# Patient Record
Sex: Female | Born: 1935 | Race: White | Hispanic: No | Marital: Married | Smoking: Former smoker
Health system: Southern US, Community
[De-identification: ages and names within clinical notes are randomized; demographics above are authoritative.]

## PROBLEM LIST (undated history)

## (undated) DIAGNOSIS — M1990 Unspecified osteoarthritis, unspecified site: Secondary | ICD-10-CM

## (undated) DIAGNOSIS — I10 Essential (primary) hypertension: Secondary | ICD-10-CM

## (undated) HISTORY — PX: ABDOMINAL HYSTERECTOMY: SHX81

---

## 1993-01-03 HISTORY — PX: JOINT REPLACEMENT: SHX530

## 1995-01-04 HISTORY — PX: JOINT REPLACEMENT: SHX530

## 1997-05-13 ENCOUNTER — Other Ambulatory Visit: Admission: RE | Admit: 1997-05-13 | Discharge: 1997-05-13 | Payer: Self-pay | Admitting: Obstetrics and Gynecology

## 1998-05-18 ENCOUNTER — Other Ambulatory Visit: Admission: RE | Admit: 1998-05-18 | Discharge: 1998-05-18 | Payer: Self-pay | Admitting: Obstetrics and Gynecology

## 1998-09-24 ENCOUNTER — Encounter (INDEPENDENT_AMBULATORY_CARE_PROVIDER_SITE_OTHER): Payer: Self-pay

## 1998-09-24 ENCOUNTER — Inpatient Hospital Stay (HOSPITAL_COMMUNITY): Admission: RE | Admit: 1998-09-24 | Discharge: 1998-09-26 | Payer: Self-pay | Admitting: Obstetrics and Gynecology

## 1998-11-24 ENCOUNTER — Encounter: Payer: Self-pay | Admitting: Obstetrics and Gynecology

## 1998-11-24 ENCOUNTER — Ambulatory Visit (HOSPITAL_COMMUNITY): Admission: RE | Admit: 1998-11-24 | Discharge: 1998-11-24 | Payer: Self-pay | Admitting: Obstetrics and Gynecology

## 1998-12-02 ENCOUNTER — Ambulatory Visit (HOSPITAL_COMMUNITY): Admission: RE | Admit: 1998-12-02 | Discharge: 1998-12-02 | Payer: Self-pay | Admitting: Obstetrics and Gynecology

## 1998-12-02 ENCOUNTER — Encounter: Payer: Self-pay | Admitting: Obstetrics and Gynecology

## 1999-02-19 ENCOUNTER — Encounter: Payer: Self-pay | Admitting: Obstetrics and Gynecology

## 1999-02-23 ENCOUNTER — Inpatient Hospital Stay (HOSPITAL_COMMUNITY): Admission: RE | Admit: 1999-02-23 | Discharge: 1999-02-25 | Payer: Self-pay | Admitting: Obstetrics and Gynecology

## 1999-05-24 ENCOUNTER — Other Ambulatory Visit: Admission: RE | Admit: 1999-05-24 | Discharge: 1999-05-24 | Payer: Self-pay | Admitting: Obstetrics and Gynecology

## 1999-06-22 ENCOUNTER — Encounter: Payer: Self-pay | Admitting: Internal Medicine

## 1999-06-22 ENCOUNTER — Encounter: Admission: RE | Admit: 1999-06-22 | Discharge: 1999-06-22 | Payer: Self-pay | Admitting: Internal Medicine

## 2000-04-05 ENCOUNTER — Ambulatory Visit (HOSPITAL_COMMUNITY): Admission: RE | Admit: 2000-04-05 | Discharge: 2000-04-05 | Payer: Self-pay | Admitting: Internal Medicine

## 2000-04-05 ENCOUNTER — Encounter: Payer: Self-pay | Admitting: Internal Medicine

## 2000-06-19 ENCOUNTER — Other Ambulatory Visit: Admission: RE | Admit: 2000-06-19 | Discharge: 2000-06-19 | Payer: Self-pay | Admitting: Obstetrics and Gynecology

## 2000-06-22 ENCOUNTER — Encounter: Admission: RE | Admit: 2000-06-22 | Discharge: 2000-06-22 | Payer: Self-pay | Admitting: Internal Medicine

## 2000-06-22 ENCOUNTER — Encounter: Payer: Self-pay | Admitting: Internal Medicine

## 2001-05-16 ENCOUNTER — Ambulatory Visit (HOSPITAL_COMMUNITY): Admission: RE | Admit: 2001-05-16 | Discharge: 2001-05-16 | Payer: Self-pay | Admitting: Gastroenterology

## 2001-05-16 ENCOUNTER — Encounter (INDEPENDENT_AMBULATORY_CARE_PROVIDER_SITE_OTHER): Payer: Self-pay | Admitting: Specialist

## 2001-06-28 ENCOUNTER — Encounter: Payer: Self-pay | Admitting: Internal Medicine

## 2001-06-28 ENCOUNTER — Encounter: Admission: RE | Admit: 2001-06-28 | Discharge: 2001-06-28 | Payer: Self-pay | Admitting: Internal Medicine

## 2001-07-19 ENCOUNTER — Other Ambulatory Visit: Admission: RE | Admit: 2001-07-19 | Discharge: 2001-07-19 | Payer: Self-pay | Admitting: Obstetrics and Gynecology

## 2001-08-27 ENCOUNTER — Ambulatory Visit (HOSPITAL_COMMUNITY): Admission: RE | Admit: 2001-08-27 | Discharge: 2001-08-27 | Payer: Self-pay | Admitting: Internal Medicine

## 2001-12-19 ENCOUNTER — Encounter: Payer: Self-pay | Admitting: Emergency Medicine

## 2001-12-19 ENCOUNTER — Emergency Department (HOSPITAL_COMMUNITY): Admission: EM | Admit: 2001-12-19 | Discharge: 2001-12-19 | Payer: Self-pay | Admitting: Emergency Medicine

## 2002-07-03 ENCOUNTER — Encounter: Payer: Self-pay | Admitting: Obstetrics and Gynecology

## 2002-07-03 ENCOUNTER — Encounter: Admission: RE | Admit: 2002-07-03 | Discharge: 2002-07-03 | Payer: Self-pay | Admitting: Obstetrics and Gynecology

## 2003-08-21 ENCOUNTER — Encounter: Admission: RE | Admit: 2003-08-21 | Discharge: 2003-08-21 | Payer: Self-pay | Admitting: Internal Medicine

## 2004-08-27 ENCOUNTER — Encounter: Admission: RE | Admit: 2004-08-27 | Discharge: 2004-08-27 | Payer: Self-pay | Admitting: Internal Medicine

## 2005-09-08 ENCOUNTER — Encounter: Admission: RE | Admit: 2005-09-08 | Discharge: 2005-09-08 | Payer: Self-pay | Admitting: Internal Medicine

## 2006-09-13 ENCOUNTER — Encounter: Admission: RE | Admit: 2006-09-13 | Discharge: 2006-09-13 | Payer: Self-pay | Admitting: Internal Medicine

## 2007-09-19 ENCOUNTER — Encounter: Admission: RE | Admit: 2007-09-19 | Discharge: 2007-09-19 | Payer: Self-pay | Admitting: Internal Medicine

## 2008-09-23 ENCOUNTER — Encounter: Admission: RE | Admit: 2008-09-23 | Discharge: 2008-09-23 | Payer: Self-pay | Admitting: Internal Medicine

## 2008-11-05 ENCOUNTER — Encounter: Admission: RE | Admit: 2008-11-05 | Discharge: 2008-11-05 | Payer: Self-pay | Admitting: Family Medicine

## 2008-11-05 IMAGING — US US ABDOMEN COMPLETE
1 series · 14 of 25 positions shown · non-contrast
Comparison: None.

CLINICAL DATA: Abdominal pain and dysphagia.

COMPLETE ABDOMINAL ULTRASOUND

[Series 1: us abdomen complete · 0.22mm/px · 14 of 86 slices shown]
[im 1/86]
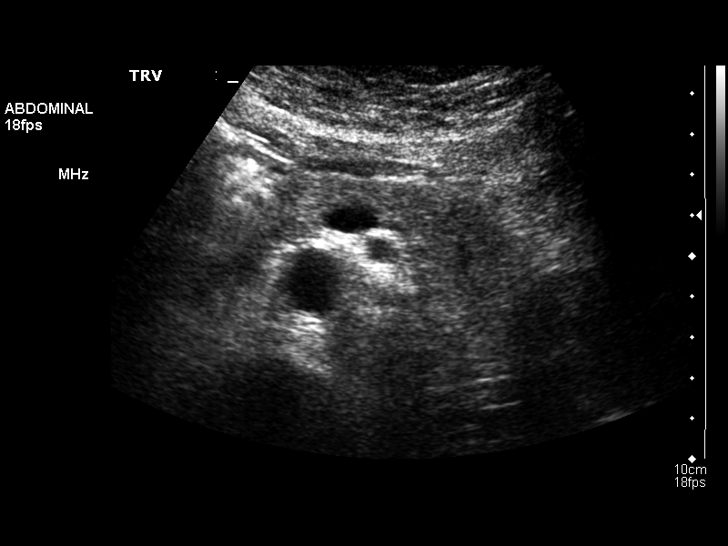
[im 8/86]
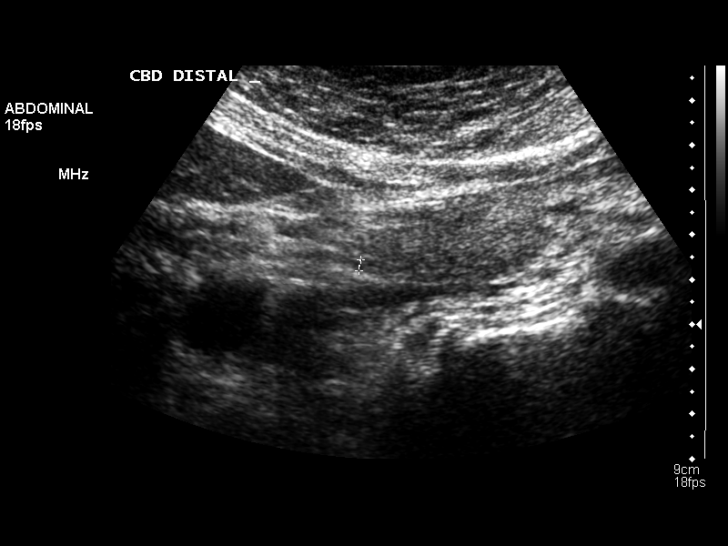
[im 15/86]
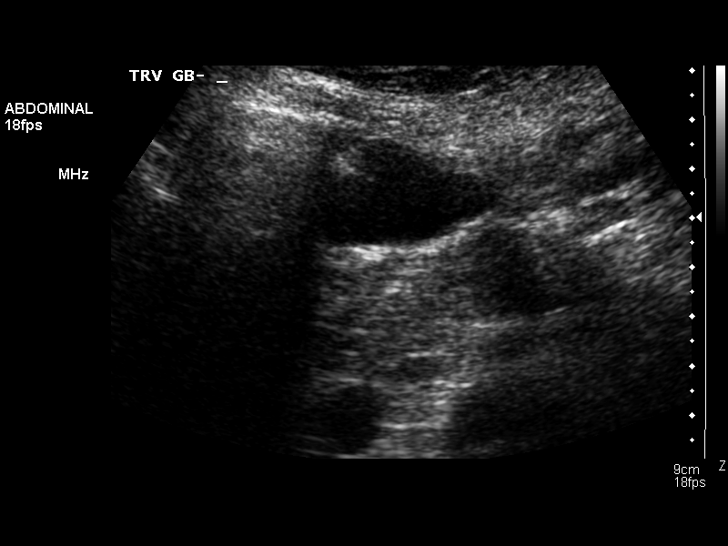
[im 22/86]
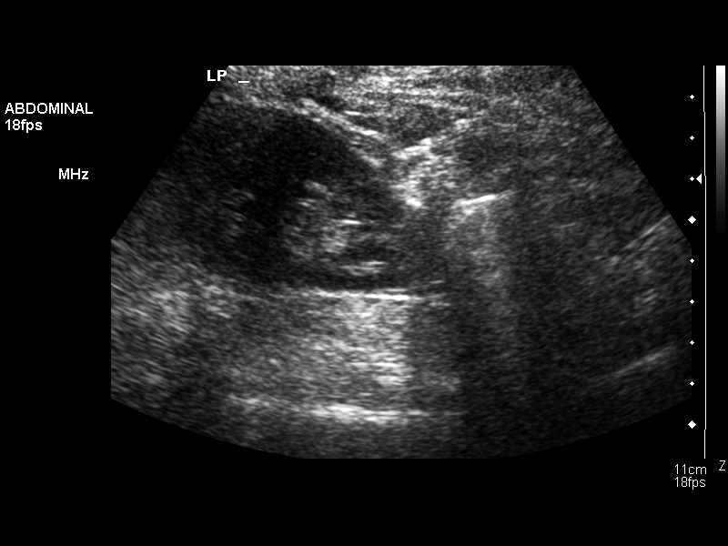
[im 29/86]
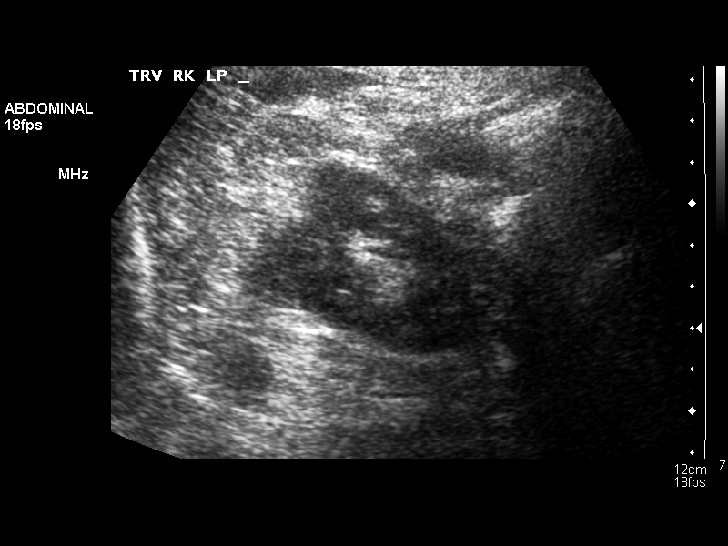
[im 32/86]
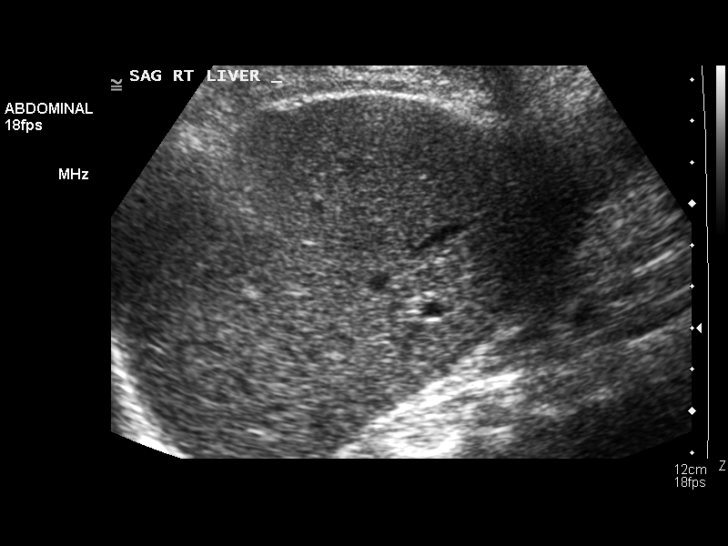
[im 39/86]
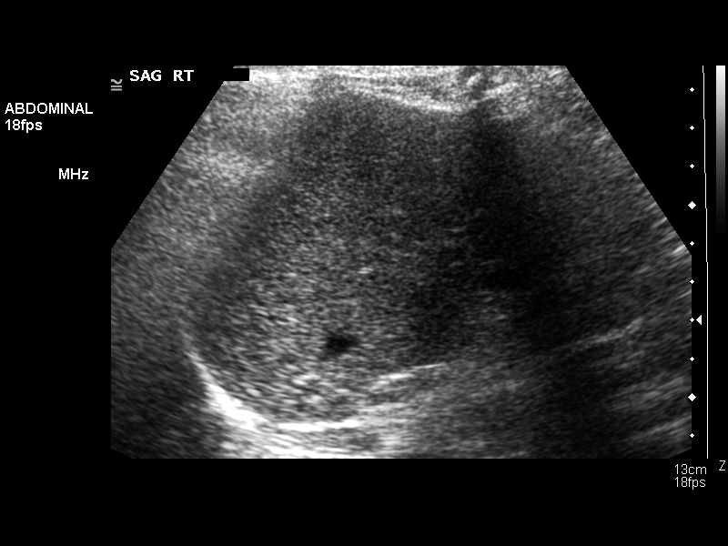
[im 47/86]
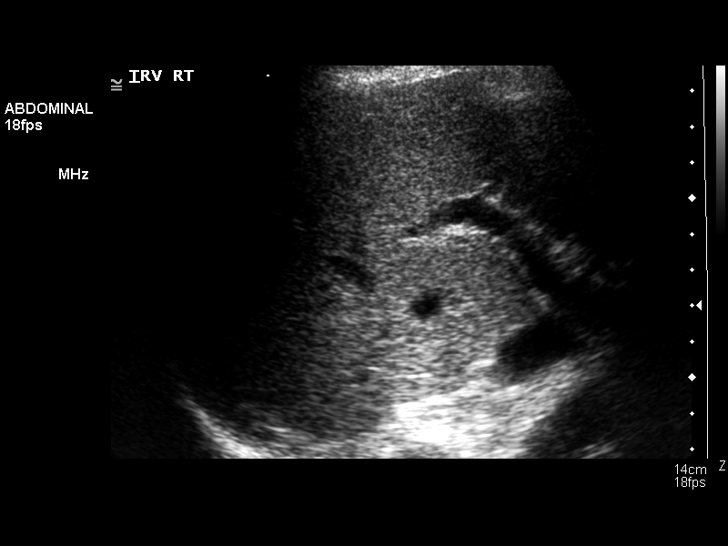
[im 54/86]
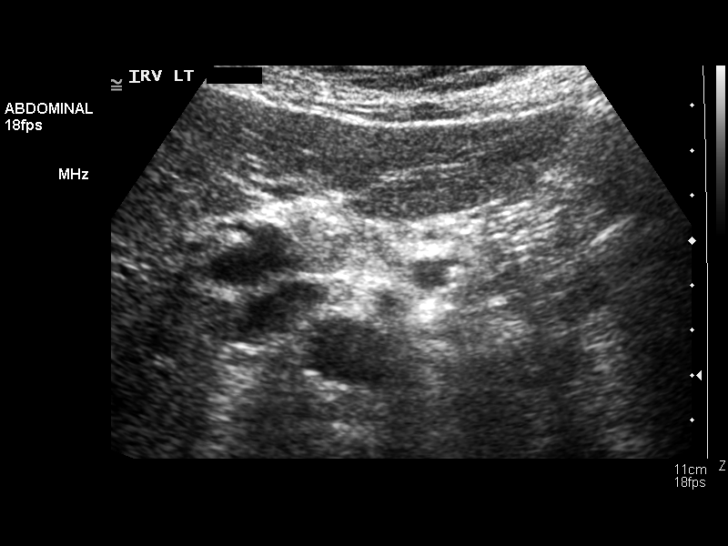
[im 57/86]
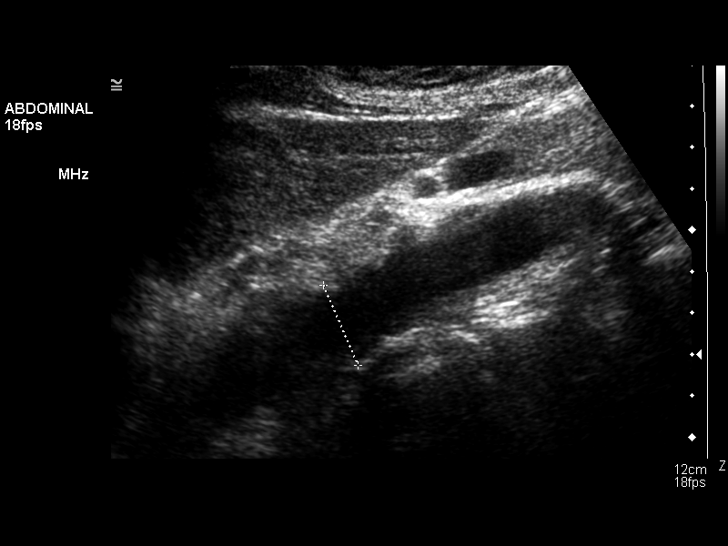
[im 64/86]
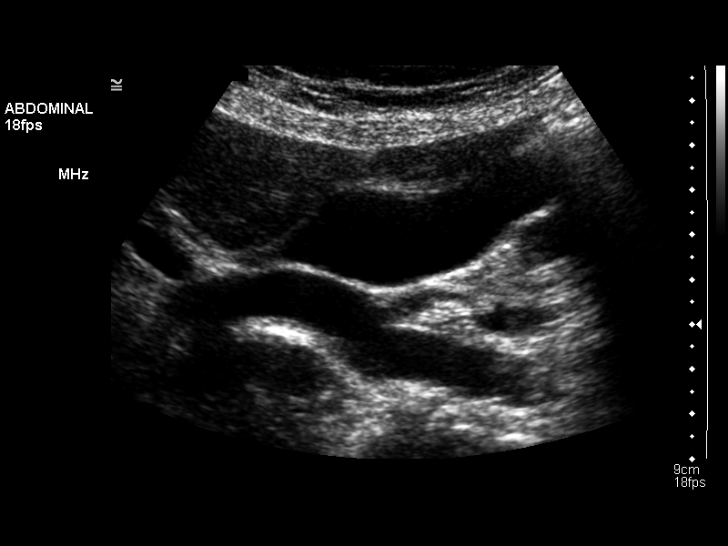
[im 71/86]
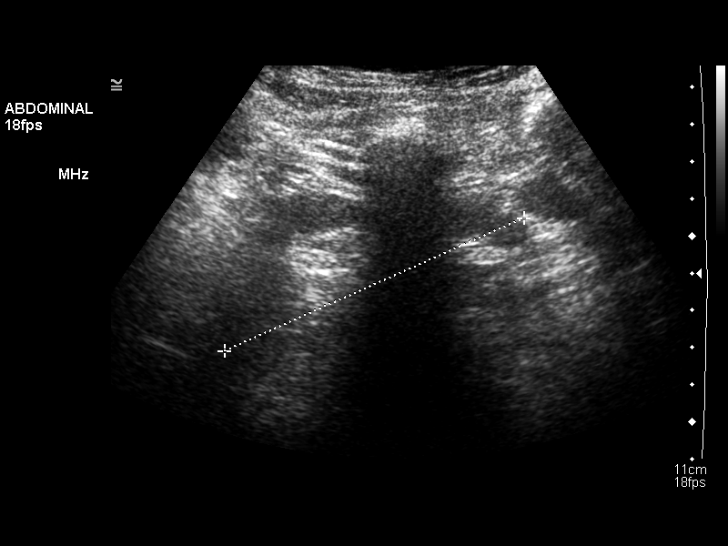
[im 78/86]
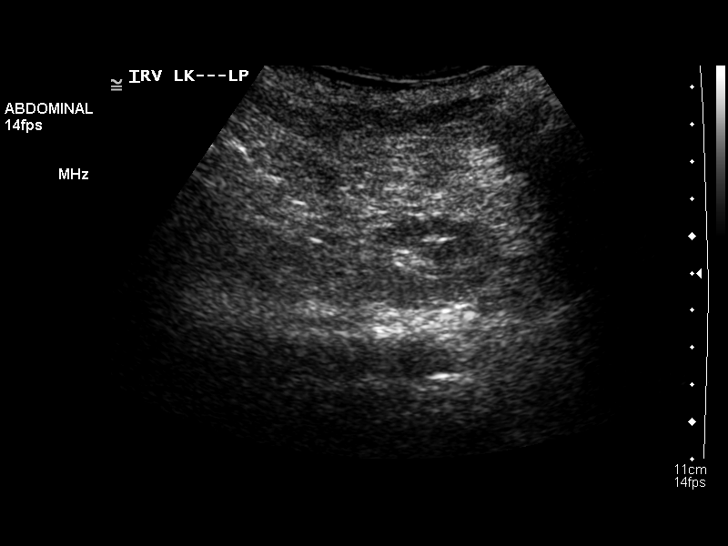
[im 86/86]
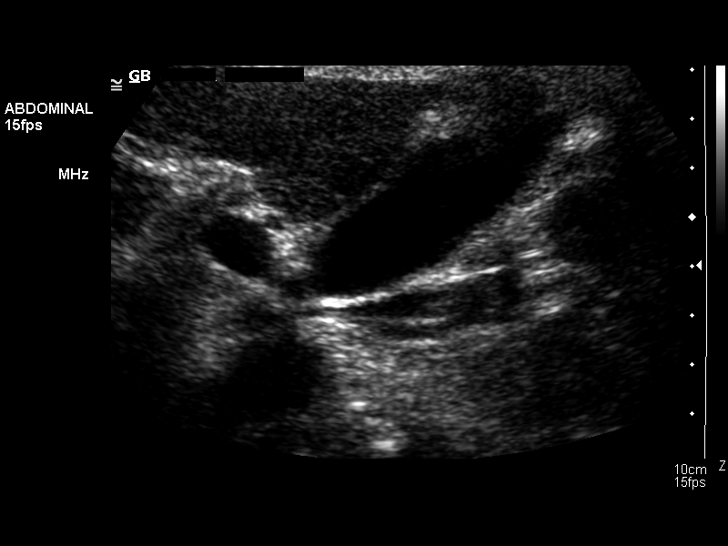

[14 of 25 positions shown; findings below may reference images not displayed]

FINDINGS: Gallbladder:  Negative.

Common bile duct:  3 mm, within normal limits.

Liver:  Negative.

IVC:  Visualized.

Pancreas:  Negative.

Spleen:  Measures 3.3 cm, negative.

Right Kidney:  Measures 11.4 cm, negative.

Left Kidney:  Visualization is somewhat limited by bowel gas.
Measures approximately 8.8 cm.

Abdominal aorta:  Atherosclerotic.  No aneurysm.
IMPRESSION: No acute findings.

## 2009-09-24 ENCOUNTER — Encounter: Admission: RE | Admit: 2009-09-24 | Discharge: 2009-09-24 | Payer: Self-pay | Admitting: Internal Medicine

## 2010-05-21 NOTE — Procedures (Signed)
[facility]  Patient:    Shelley Sloan, VOLK Visit Number: [id number] MRN: [id number]          Service Type: END Location: ENDO Attending Physician:  Dennison Bulla Ii Dictated by:   Verlin Grills, M.D. Proc. Date: 05/16/01 Admit Date:  05/16/2001   CC:         Erskine Speed, M.D.   Procedure Report  PROCEDURE:  Colonoscopy with rectal polypectomy.  REFERRING PHYSICIAN:  Erskine Speed, M.D.  INDICATION FOR PROCEDURE:  Ms. Lorraine Cimmino is a 77 year old female born 1923-01-29. Ms. Orzel is scheduled for her first screening colonoscopy with polypectomy to prevent colon cancer. I discussed with Ms. Hauge the complications associated with colonoscopy and polypectomy including a 15/1000 risk of bleeding and 04/998 risk of colon perforation requiring surgical repair. Ms. Hauter has signed the operative permit.  ENDOSCOPIST:  Verlin Grills, M.D.  PREMEDICATION:  Versed 5 mg, Demerol 30 mg.  ENDOSCOPE:  Olympus pediatric colonoscope.  DESCRIPTION OF PROCEDURE:  After obtaining informed consent, Ms. Bencomo was placed in the left lateral decubitus position. I administered intravenous Demerol and intravenous Versed to achieve conscious sedation for the procedure. The patients cardiac rhythm, oxygen saturation and blood pressure were monitored throughout the procedure and documented in the medical record.  Anal inspection was normal. Digital rectal exam was normal. The Olympus pediatric video colonoscope was introduced into the rectum and advanced to the cecum. Colonic preparation for the exam today was excellent. Ms. Kinnamon consumed the Visicol colonic lavage tablets.  RECTUM:  From the proximal rectum, a 3 mm sessile polyp was removed with electrocautery snare and submitted for pathological interpretation.  SIGMOID COLON/DESCENDING COLON:  Normal.  SPLENIC FLEXURE:  Normal.  TRANSVERSE COLON:  Normal.  HEPATIC FLEXURE:   Normal.  ASCENDING COLON:  Normal.  CECUM/ILEOCECAL VALVE:  Normal.  ASSESSMENT:  A 3 mm sessile polyp was removed from the proximal rectum with the electrocautery snare and submitted for pathological interpretation; otherwise normal proctocolonoscopy to the cecum. Dictated by:   Verlin Grills, M.D. Attending Physician:  Dennison Bulla Ii DD:  05/16/01 TD:  05/17/01 Job: [id number]

## 2010-08-30 ENCOUNTER — Other Ambulatory Visit: Payer: Self-pay | Admitting: Internal Medicine

## 2010-08-30 DIAGNOSIS — Z1231 Encounter for screening mammogram for malignant neoplasm of breast: Secondary | ICD-10-CM

## 2010-09-27 ENCOUNTER — Other Ambulatory Visit: Payer: Self-pay | Admitting: Gastroenterology

## 2010-09-30 ENCOUNTER — Ambulatory Visit
Admission: RE | Admit: 2010-09-30 | Discharge: 2010-09-30 | Disposition: A | Discharge status: Home / Self Care | Payer: Medicare Other | Source: Ambulatory Visit | Attending: Internal Medicine | Admitting: Internal Medicine

## 2010-09-30 DIAGNOSIS — Z1231 Encounter for screening mammogram for malignant neoplasm of breast: Secondary | ICD-10-CM

## 2011-09-13 ENCOUNTER — Other Ambulatory Visit: Payer: Self-pay | Admitting: Internal Medicine

## 2011-09-13 DIAGNOSIS — Z1231 Encounter for screening mammogram for malignant neoplasm of breast: Secondary | ICD-10-CM

## 2011-10-04 ENCOUNTER — Ambulatory Visit
Admission: RE | Admit: 2011-10-04 | Discharge: 2011-10-04 | Disposition: A | Discharge status: Home / Self Care | Payer: Medicare Other | Source: Ambulatory Visit | Attending: Internal Medicine | Admitting: Internal Medicine

## 2011-10-04 DIAGNOSIS — Z1231 Encounter for screening mammogram for malignant neoplasm of breast: Secondary | ICD-10-CM

## 2011-10-05 ENCOUNTER — Ambulatory Visit: Payer: PRIVATE HEALTH INSURANCE

## 2012-04-26 ENCOUNTER — Other Ambulatory Visit: Payer: Self-pay | Admitting: Internal Medicine

## 2012-04-26 DIAGNOSIS — IMO0002 Reserved for concepts with insufficient information to code with codable children: Secondary | ICD-10-CM

## 2012-05-03 ENCOUNTER — Ambulatory Visit
Admission: RE | Admit: 2012-05-03 | Discharge: 2012-05-03 | Disposition: A | Discharge status: Home / Self Care | Payer: Medicare Other | Source: Ambulatory Visit | Attending: Internal Medicine | Admitting: Internal Medicine

## 2012-05-03 DIAGNOSIS — IMO0002 Reserved for concepts with insufficient information to code with codable children: Secondary | ICD-10-CM

## 2012-05-03 IMAGING — CT CT CHEST W/ CM
3 of 4 series · 16 of 30 positions shown, 17 images · IV contrast (75CC OMNI 300)
Comparison: None.

CLINICAL DATA: Right lateral and anterior chest wall mass palpable.

CT CHEST WITH CONTRAST
TECHNIQUE: Multidetector CT imaging of the chest was performed
following the standard protocol during bolus administration of
intravenous contrast.
Contrast: 75mL OMNIPAQUE IOHEXOL 300 MG/ML  SOLN

[Series 3: chest with · axial · 0.78mm/px · z∈[-242,-52]mm · 4 of 64 slices shown, 5 images]
[im 13/64  mediastinal]
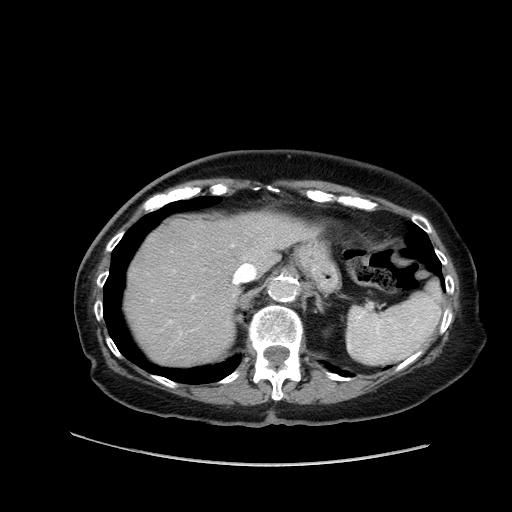
[im 13/64  lung]
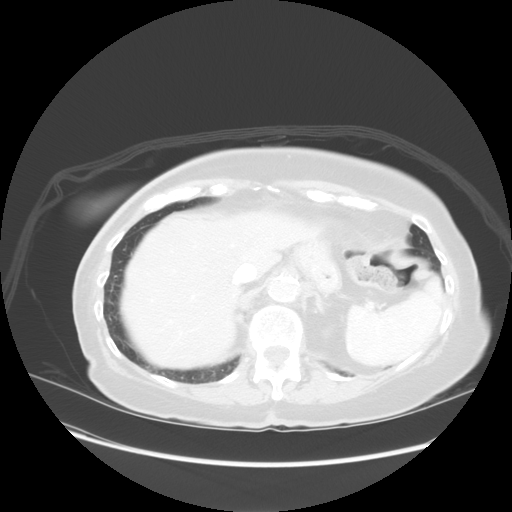
[im 26/64  lung]
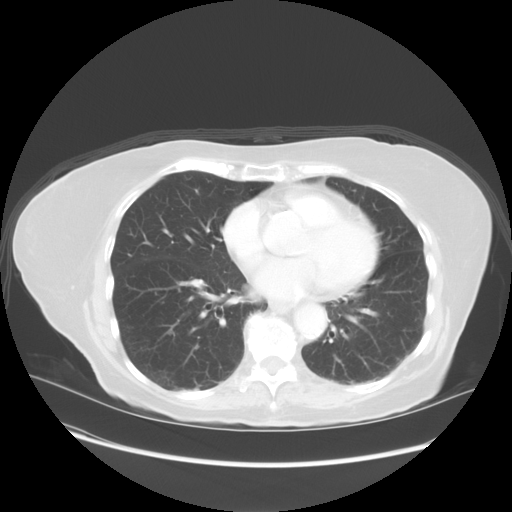
[im 38/64  lung]
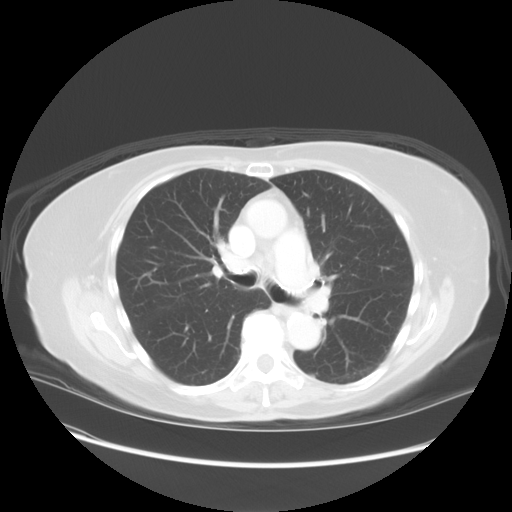
[im 51/64  lung]
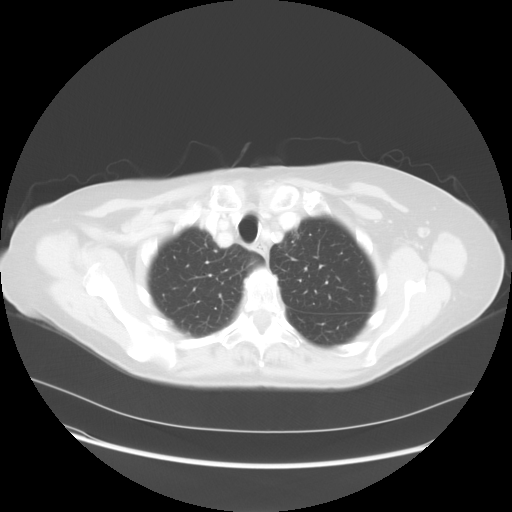

[Series 4: lung windows · axial · 0.78mm/px · z∈[-212,-47]mm · 4 of 57 slices shown]
[im 12/57  lung]
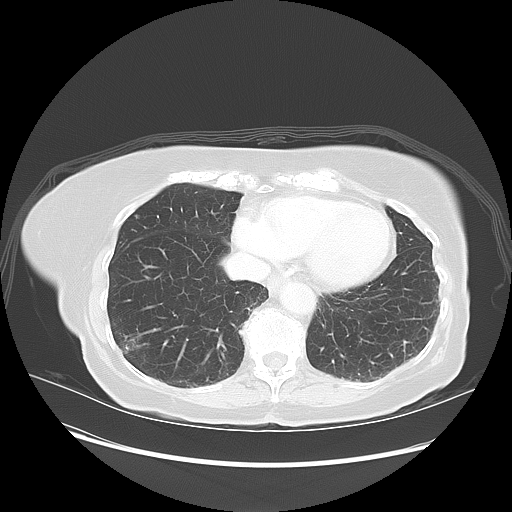
[im 23/57  lung]
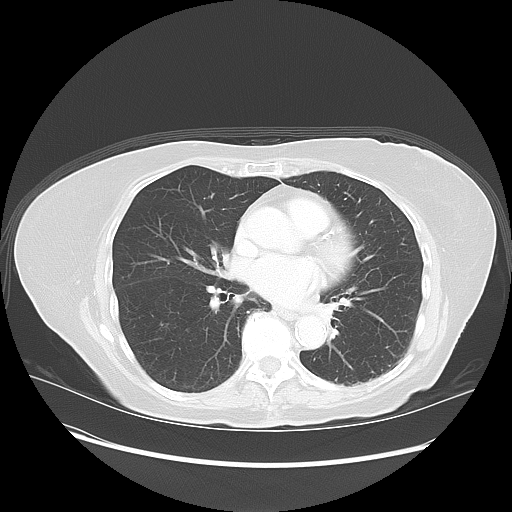
[im 34/57  lung]
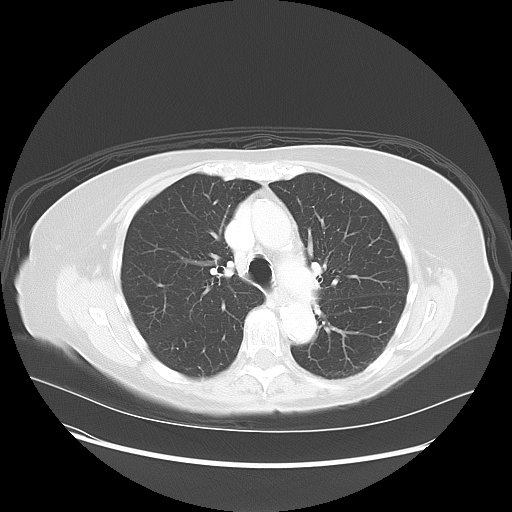
[im 45/57  lung]
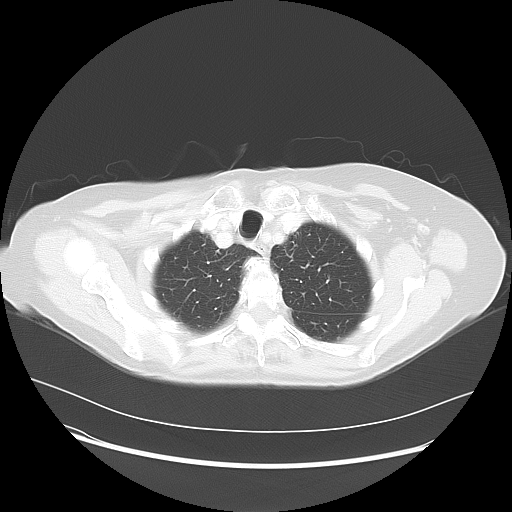

[Series 602: sagittal body · sagittal · 0.78mm/px · 8 of 161 slices shown]
[im 11/161  mediastinal]
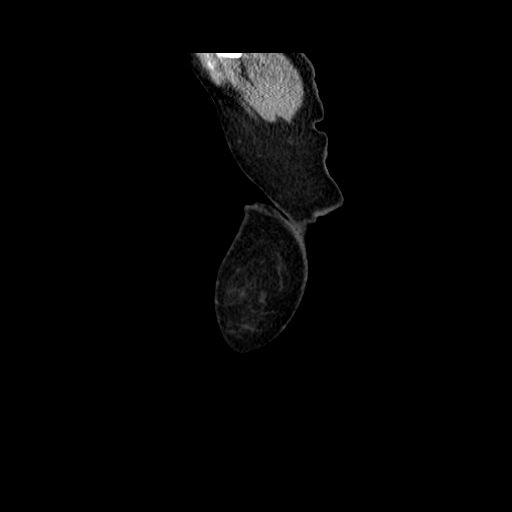
[im 33/161  mediastinal]
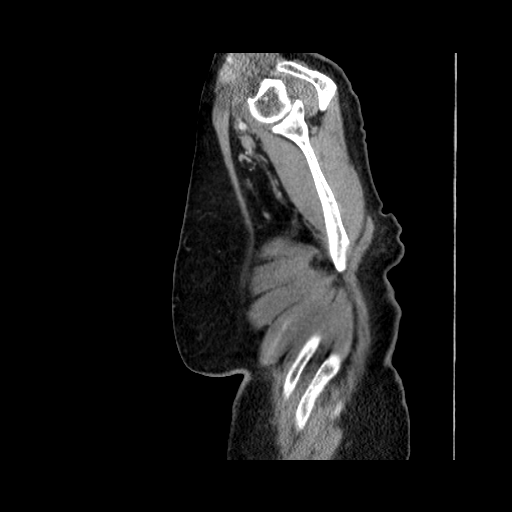
[im 54/161  mediastinal]
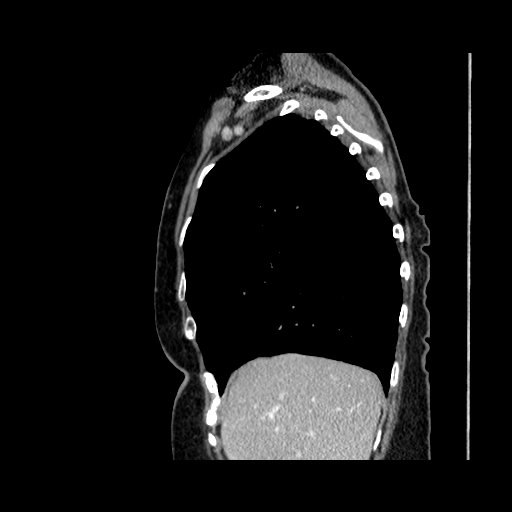
[im 75/161  mediastinal]
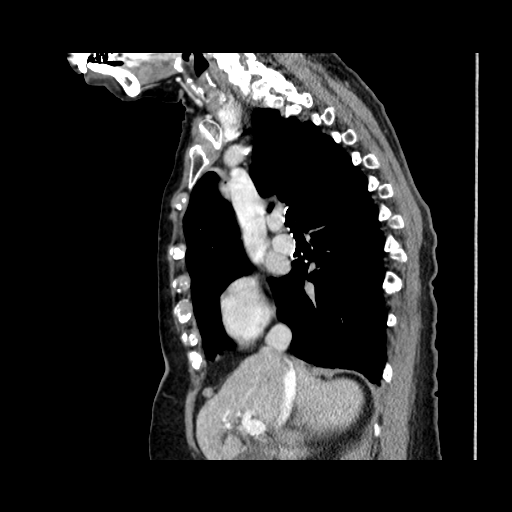
[im 86/161  mediastinal]
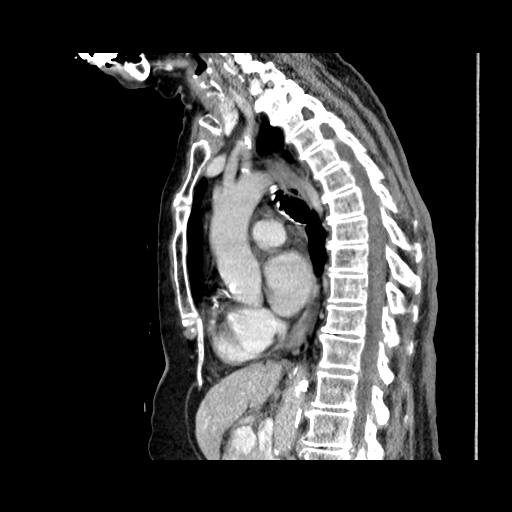
[im 107/161  mediastinal]
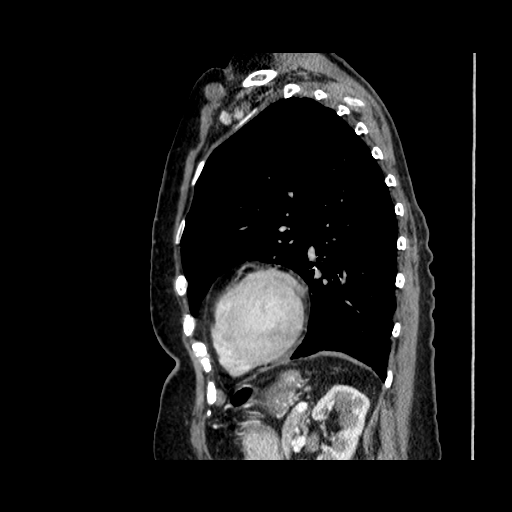
[im 129/161  mediastinal]
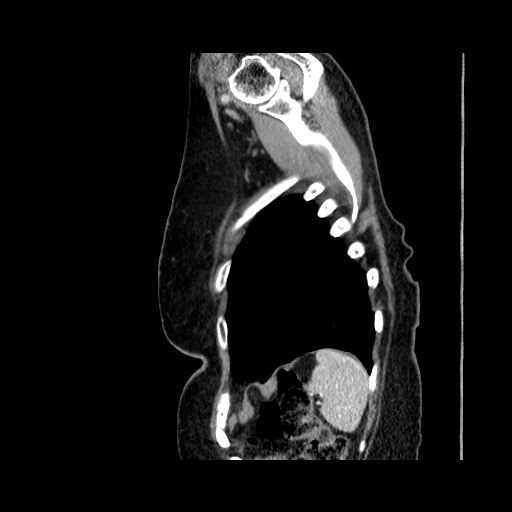
[im 150/161  mediastinal]
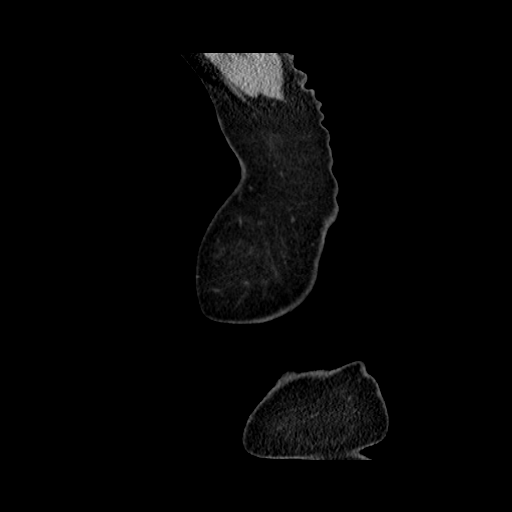

[16 of 30 positions shown; findings below may reference images not displayed]

FINDINGS: Mild biapical scarring.  Linear densities posteriorly in
the lungs could represent dependent atelectasis or scarring.  There
are scattered small subpleural nodules in the left lower lobe
dependently.  I suspect this represents scarring as well.  No
pleural effusions. Heart is normal size. Aorta is normal caliber.
Coronary artery and aortic calcifications.

There are small scattered mediastinal lymph nodes, none
pathologically enlarged.  No hilar or axillary adenopathy.  There
are small right axillary lymph nodes underlying the area marked
with a vitamin E bead.  These are not abnormally enlarged.  These
maintain normal central fatty hila.  No visible chest wall mass.
No acute bony abnormality.  Degenerative changes in the thoracic
spine.

Imaging into the upper abdomen shows no acute findings.
IMPRESSION: No visible abnormality in the area of concern in the right
axilla/anterior upper chest.  There are small underlying axillary
lymph nodes, but these are not enlarged by CT criteria and maintain
normal central fatty hila.

Chronic changes in the lungs.  No acute process.

Coronary artery disease.

## 2012-05-03 MED ORDER — IOHEXOL 300 MG/ML  SOLN
75.0000 mL | Freq: Once | INTRAMUSCULAR | Status: AC | PRN
  Administered 2012-05-03: 75 mL via INTRAVENOUS

## 2012-05-31 ENCOUNTER — Other Ambulatory Visit: Payer: Self-pay | Admitting: Internal Medicine

## 2012-05-31 DIAGNOSIS — N644 Mastodynia: Secondary | ICD-10-CM

## 2012-05-31 DIAGNOSIS — N63 Unspecified lump in unspecified breast: Secondary | ICD-10-CM

## 2012-06-08 ENCOUNTER — Ambulatory Visit
Admission: RE | Admit: 2012-06-08 | Discharge: 2012-06-08 | Disposition: A | Discharge status: Home / Self Care | Payer: Medicare Other | Source: Ambulatory Visit | Attending: Internal Medicine | Admitting: Internal Medicine

## 2012-06-08 DIAGNOSIS — N644 Mastodynia: Secondary | ICD-10-CM

## 2012-06-08 DIAGNOSIS — N63 Unspecified lump in unspecified breast: Secondary | ICD-10-CM

## 2012-09-11 ENCOUNTER — Other Ambulatory Visit: Payer: Self-pay

## 2012-09-11 DIAGNOSIS — Z1231 Encounter for screening mammogram for malignant neoplasm of breast: Secondary | ICD-10-CM

## 2012-10-09 ENCOUNTER — Ambulatory Visit
Admission: RE | Admit: 2012-10-09 | Discharge: 2012-10-09 | Disposition: A | Discharge status: Home / Self Care | Payer: Medicare Other | Source: Ambulatory Visit

## 2012-10-09 DIAGNOSIS — Z1231 Encounter for screening mammogram for malignant neoplasm of breast: Secondary | ICD-10-CM

## 2013-09-05 ENCOUNTER — Other Ambulatory Visit: Payer: Self-pay

## 2013-09-05 DIAGNOSIS — Z1231 Encounter for screening mammogram for malignant neoplasm of breast: Secondary | ICD-10-CM

## 2013-10-10 ENCOUNTER — Ambulatory Visit
Admission: RE | Admit: 2013-10-10 | Discharge: 2013-10-10 | Disposition: A | Discharge status: Home / Self Care | Payer: Medicare Other | Source: Ambulatory Visit

## 2013-10-10 DIAGNOSIS — Z1231 Encounter for screening mammogram for malignant neoplasm of breast: Secondary | ICD-10-CM

## 2014-05-23 ENCOUNTER — Telehealth: Payer: Self-pay | Admitting: *Deleted

## 2014-05-23 NOTE — Telephone Encounter (Signed)
Opened in error

## 2014-10-08 ENCOUNTER — Other Ambulatory Visit: Payer: Self-pay

## 2014-10-08 DIAGNOSIS — Z1231 Encounter for screening mammogram for malignant neoplasm of breast: Secondary | ICD-10-CM

## 2014-10-14 ENCOUNTER — Ambulatory Visit
Admission: RE | Admit: 2014-10-14 | Discharge: 2014-10-14 | Disposition: A | Discharge status: Home / Self Care | Payer: Medicare Other | Source: Ambulatory Visit

## 2014-10-14 DIAGNOSIS — Z1231 Encounter for screening mammogram for malignant neoplasm of breast: Secondary | ICD-10-CM

## 2015-05-06 ENCOUNTER — Ambulatory Visit
Admission: RE | Admit: 2015-05-06 | Discharge: 2015-05-06 | Disposition: A | Discharge status: Home / Self Care | Payer: Medicare Other | Source: Ambulatory Visit | Attending: Internal Medicine | Admitting: Internal Medicine

## 2015-05-06 ENCOUNTER — Other Ambulatory Visit: Payer: Self-pay | Admitting: Internal Medicine

## 2015-05-06 DIAGNOSIS — R0989 Other specified symptoms and signs involving the circulatory and respiratory systems: Secondary | ICD-10-CM

## 2015-05-06 IMAGING — CR DG CHEST 2V
2 series · 2 of 2 positions shown · non-contrast
Comparison: [DATE]

CLINICAL DATA: Abnormal right basilar lung sounds

EXAM:
CHEST  2 VIEW

[w chest pa]
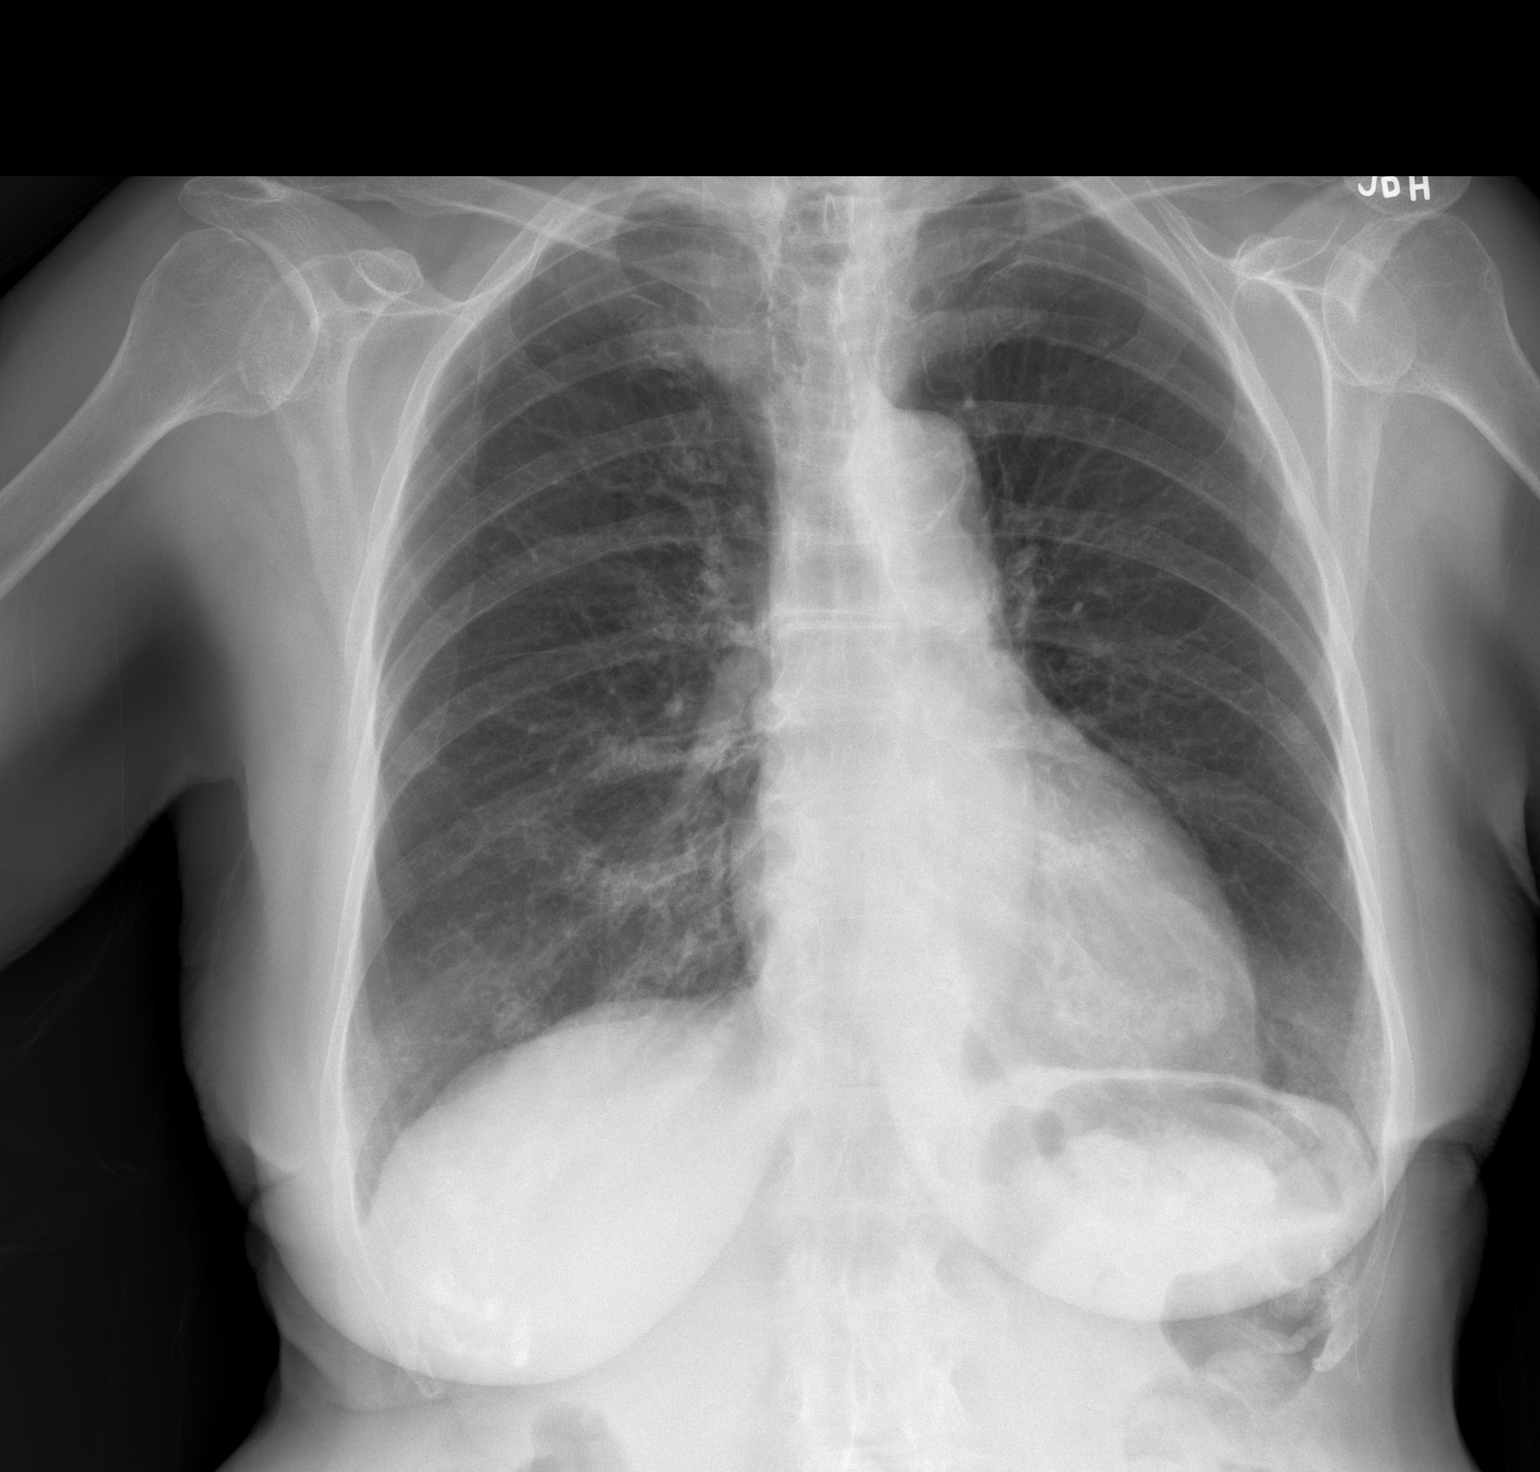

[w chest lat]
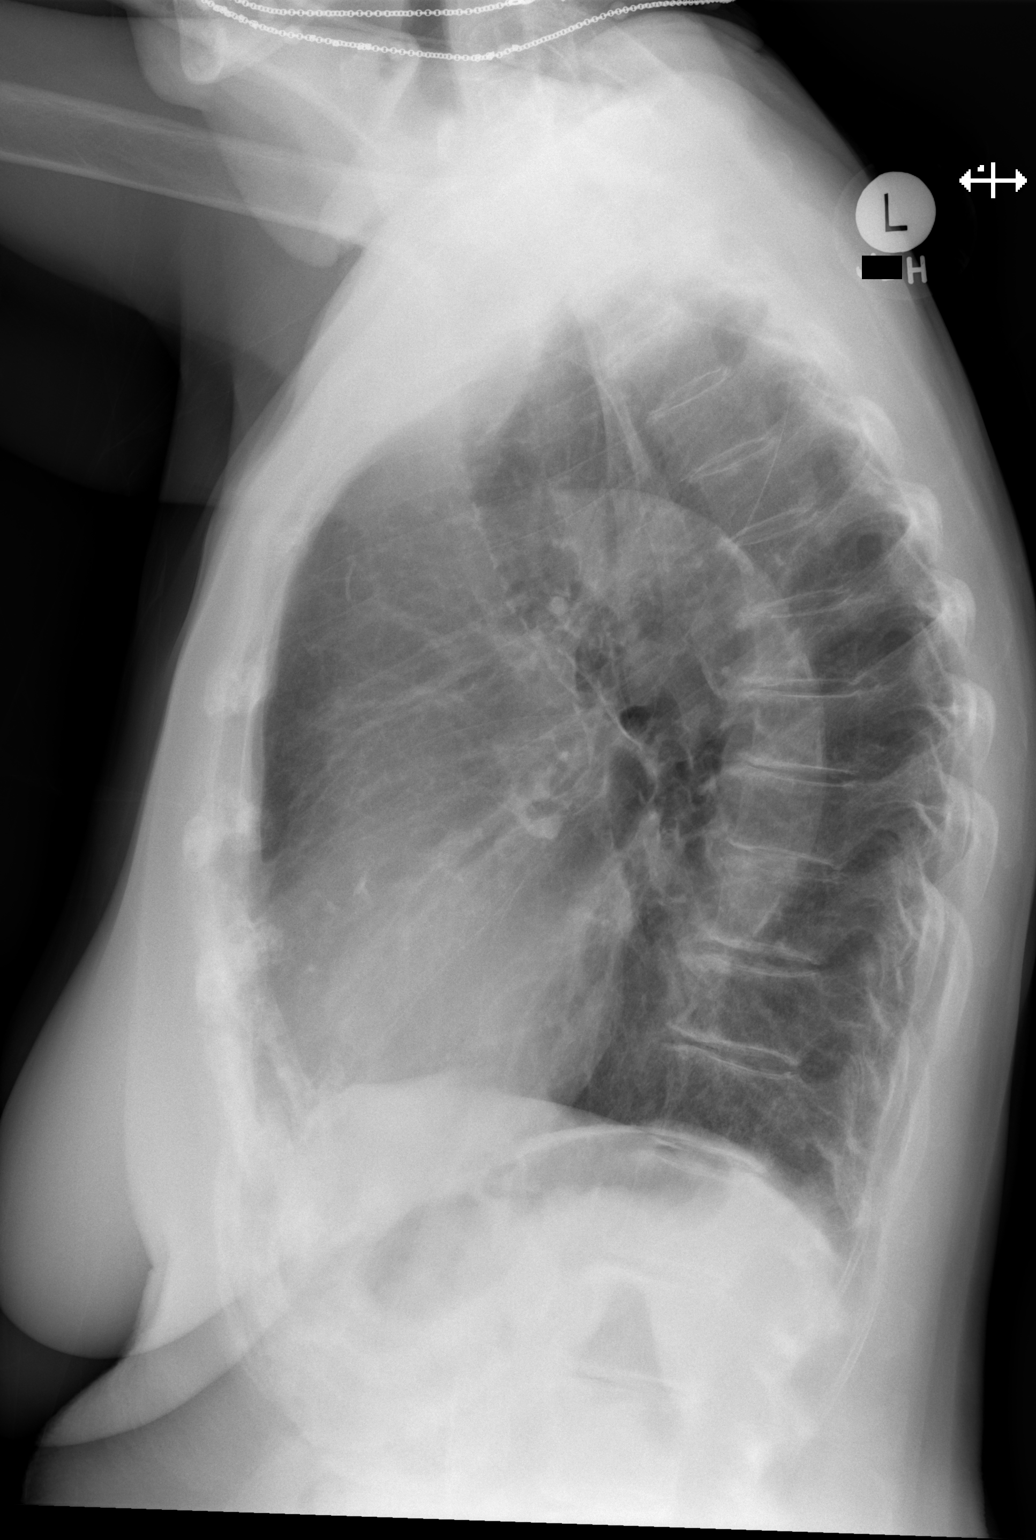

[2 of 2 positions shown; findings below may reference images not displayed]

FINDINGS: Cardiac shadow is within normal limits. The lungs are hyperinflated
consistent with COPD. No focal infiltrate or sizable effusion is
seen. No acute bony abnormality is noted.
IMPRESSION: COPD without acute abnormality.

## 2015-06-29 ENCOUNTER — Other Ambulatory Visit: Payer: Self-pay | Admitting: Internal Medicine

## 2015-06-29 ENCOUNTER — Ambulatory Visit
Admission: RE | Admit: 2015-06-29 | Discharge: 2015-06-29 | Disposition: A | Discharge status: Home / Self Care | Payer: Medicare Other | Source: Ambulatory Visit | Attending: Internal Medicine | Admitting: Internal Medicine

## 2015-06-29 DIAGNOSIS — R0989 Other specified symptoms and signs involving the circulatory and respiratory systems: Secondary | ICD-10-CM

## 2015-06-29 IMAGING — CR DG CHEST 2V
2 series · 2 of 2 positions shown · non-contrast
Comparison: [DATE]

CLINICAL DATA: Recurrent fevers

EXAM:
CHEST  2 VIEW

[w chest pa]
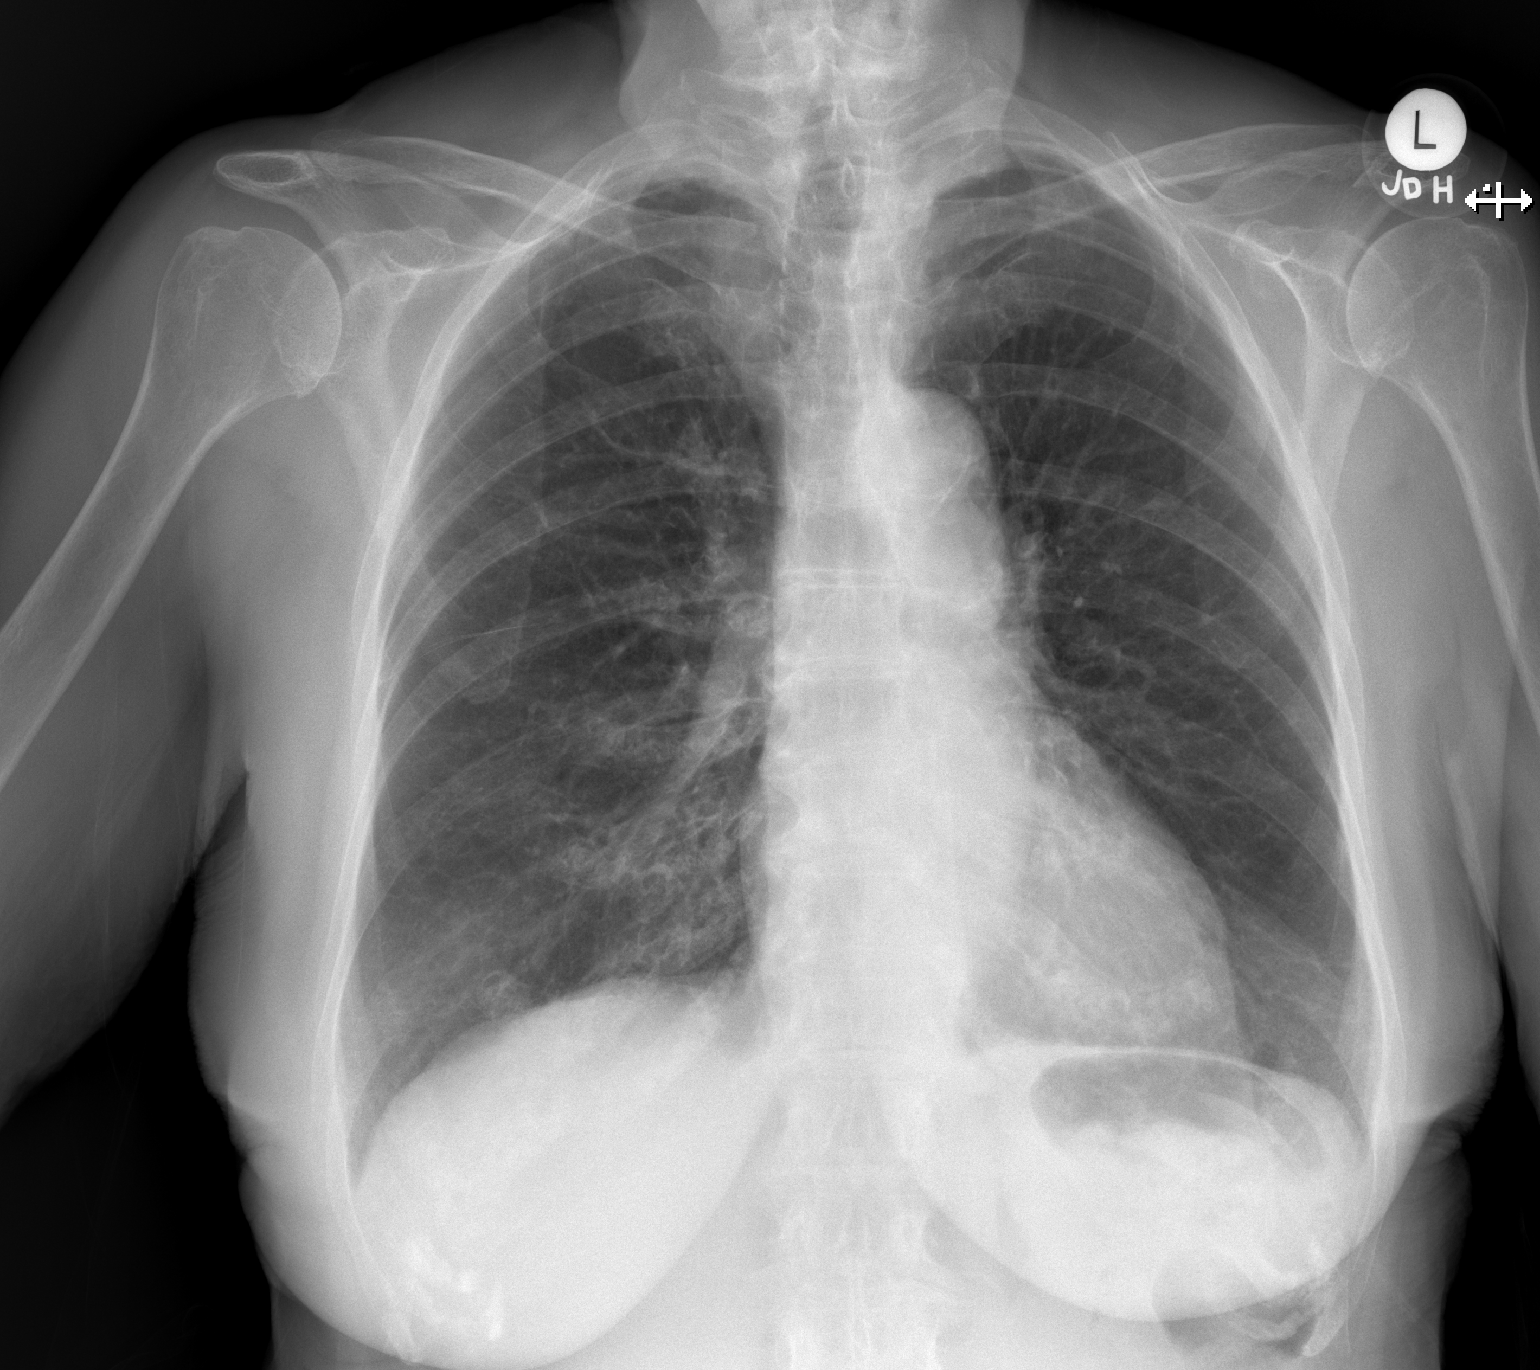

[w chest lat]
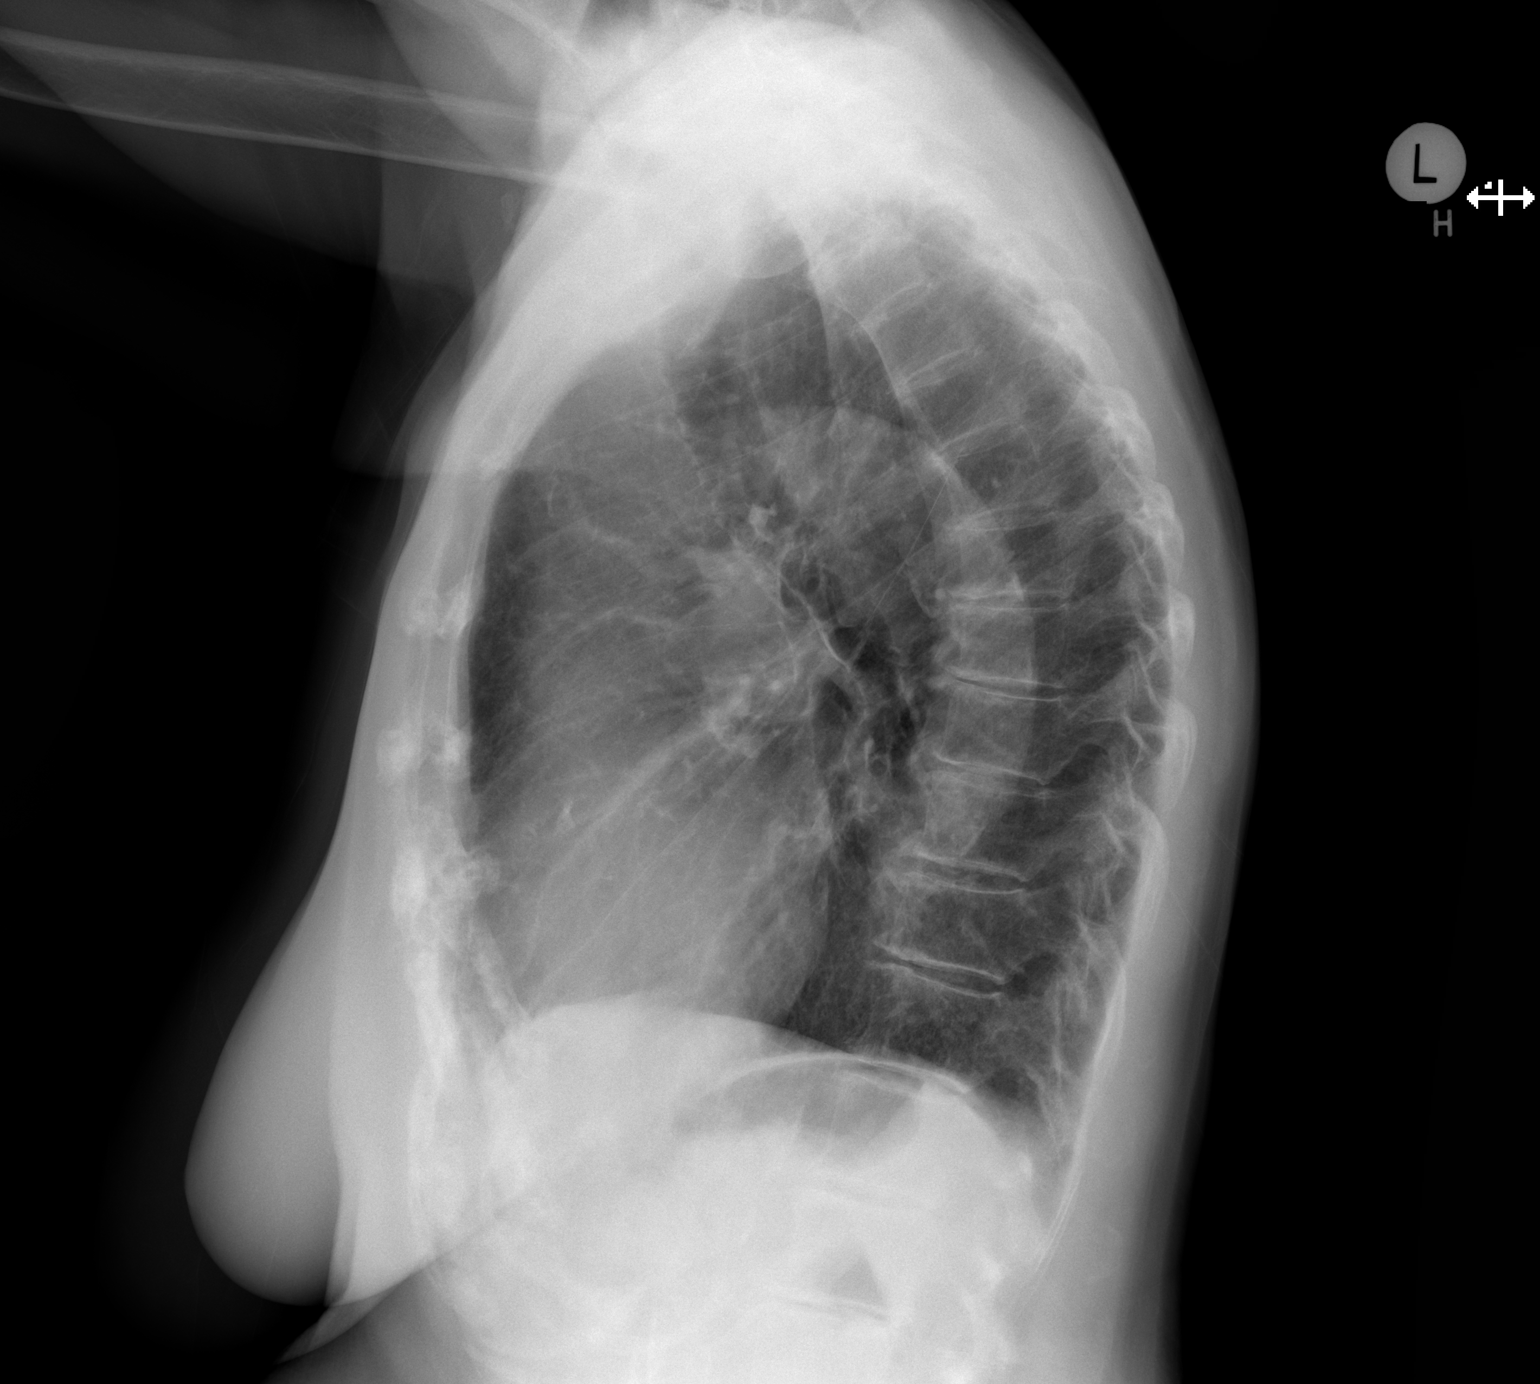

[2 of 2 positions shown; findings below may reference images not displayed]

FINDINGS: Cardiac shadow is stable. The lungs are mildly hyperaerated
consistent with COPD. No focal infiltrate or sizable effusion is
seen. No acute bony abnormality is noted.
IMPRESSION: COPD without acute abnormality.

## 2015-10-12 ENCOUNTER — Other Ambulatory Visit: Payer: Self-pay | Admitting: Internal Medicine

## 2015-10-12 DIAGNOSIS — Z1231 Encounter for screening mammogram for malignant neoplasm of breast: Secondary | ICD-10-CM

## 2015-10-19 ENCOUNTER — Ambulatory Visit: Payer: Medicare Other

## 2015-10-21 ENCOUNTER — Ambulatory Visit
Admission: RE | Admit: 2015-10-21 | Discharge: 2015-10-21 | Disposition: A | Discharge status: Home / Self Care | Payer: Medicare Other | Source: Ambulatory Visit | Attending: Internal Medicine | Admitting: Internal Medicine

## 2015-10-21 DIAGNOSIS — Z1231 Encounter for screening mammogram for malignant neoplasm of breast: Secondary | ICD-10-CM

## 2015-10-21 IMAGING — MG 2D DIGITAL SCREENING BILATERAL MAMMOGRAM WITH CAD AND ADJUNCT TO
9 of 12 series · 9 of 28 positions shown · non-contrast
Comparison: Previous exam(s).

CLINICAL DATA: Screening.

EXAM:
2D DIGITAL SCREENING BILATERAL MAMMOGRAM WITH CAD AND ADJUNCT TOMO

[R MLO synth-2D]
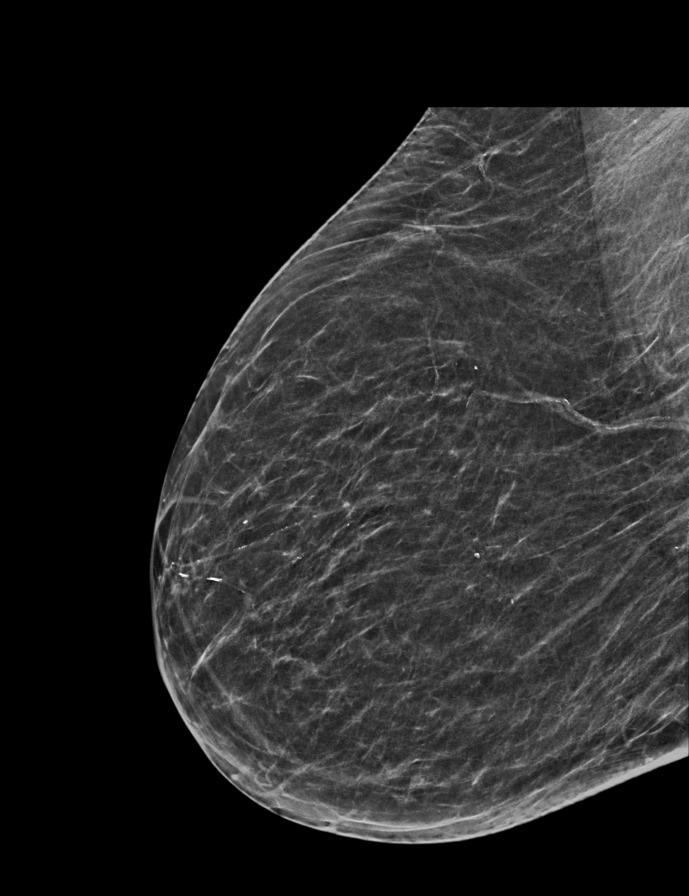

[R CC]
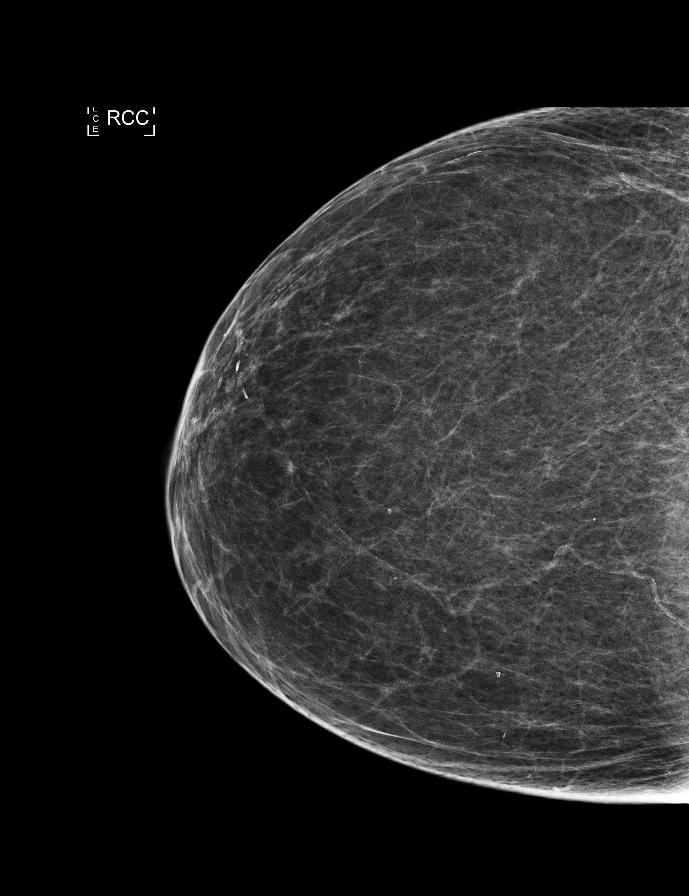

[L CC synth-2D]
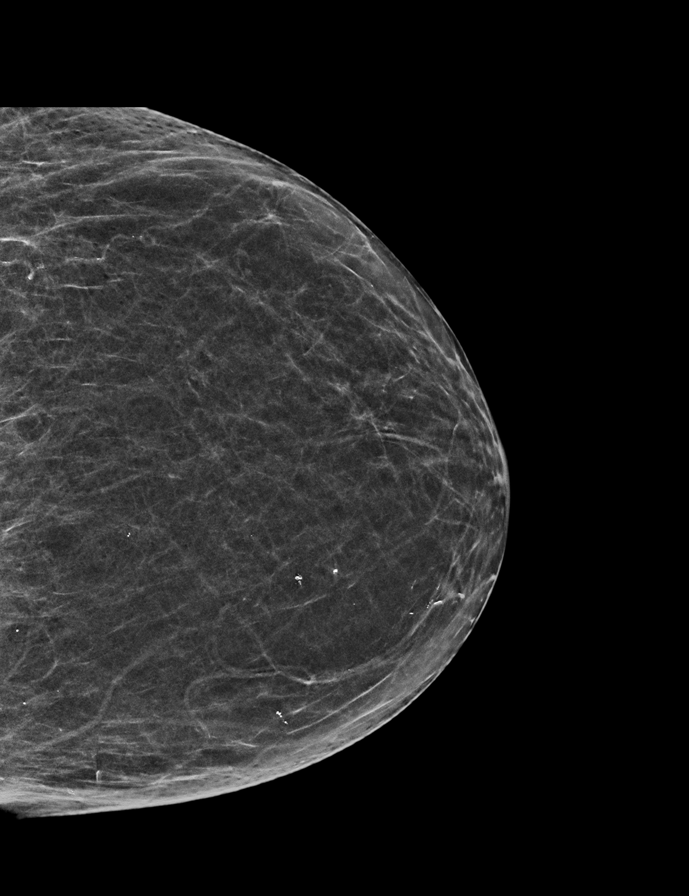

[R CC synth-2D]
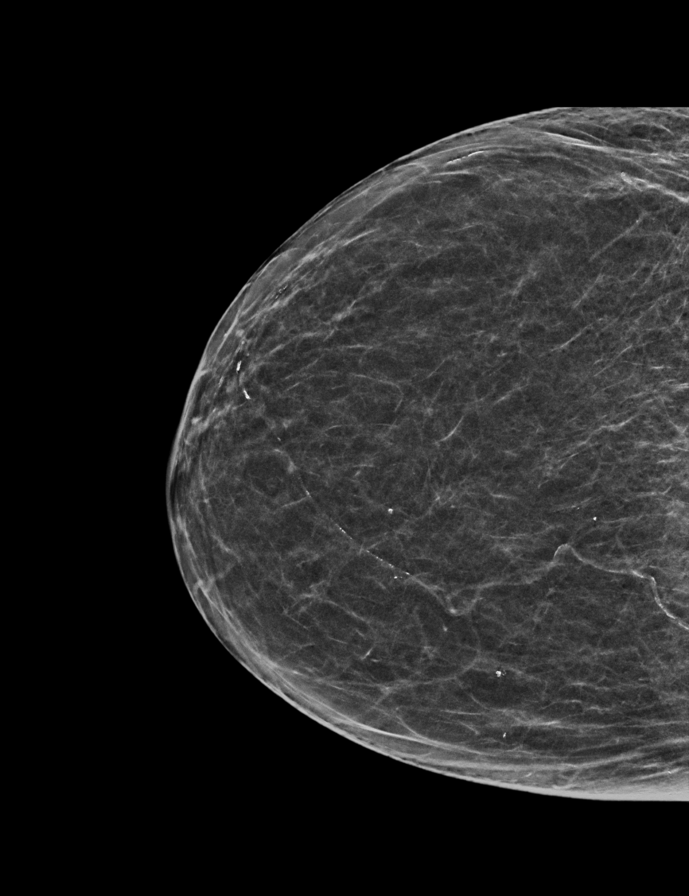

[L MLO]
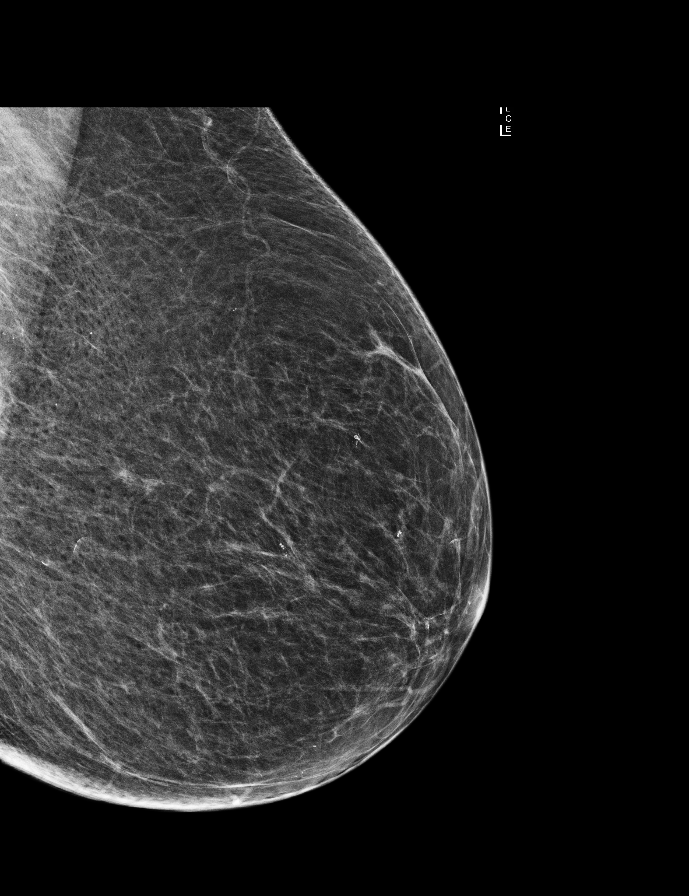

[R MLO]
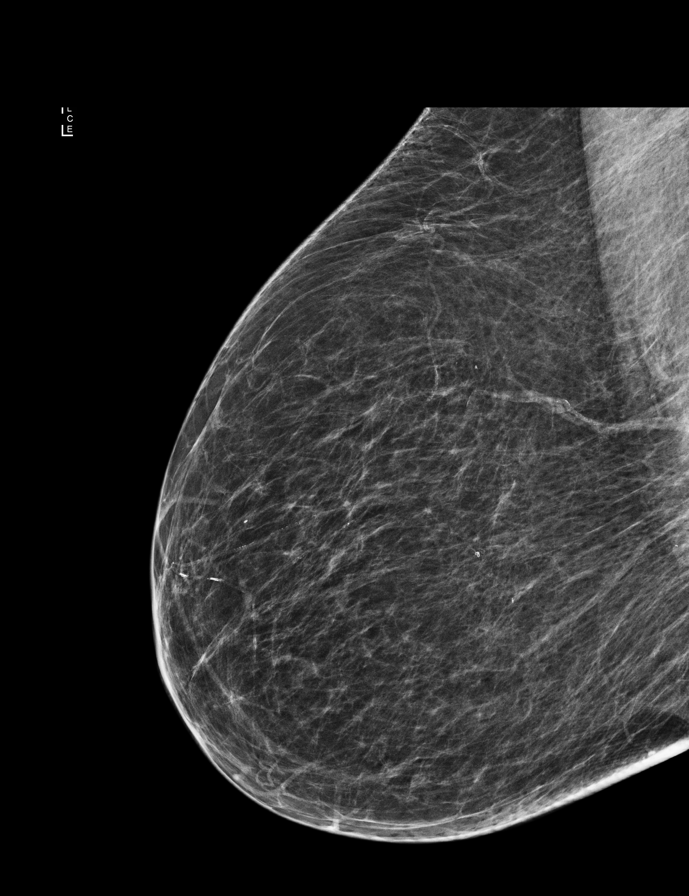

[L CC]
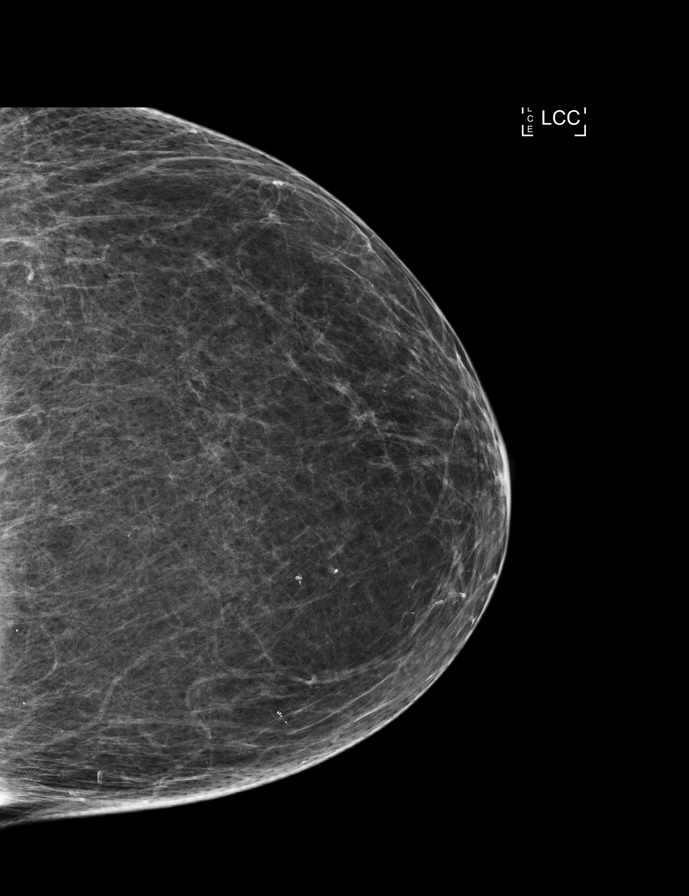

[L MLO synth-2D]
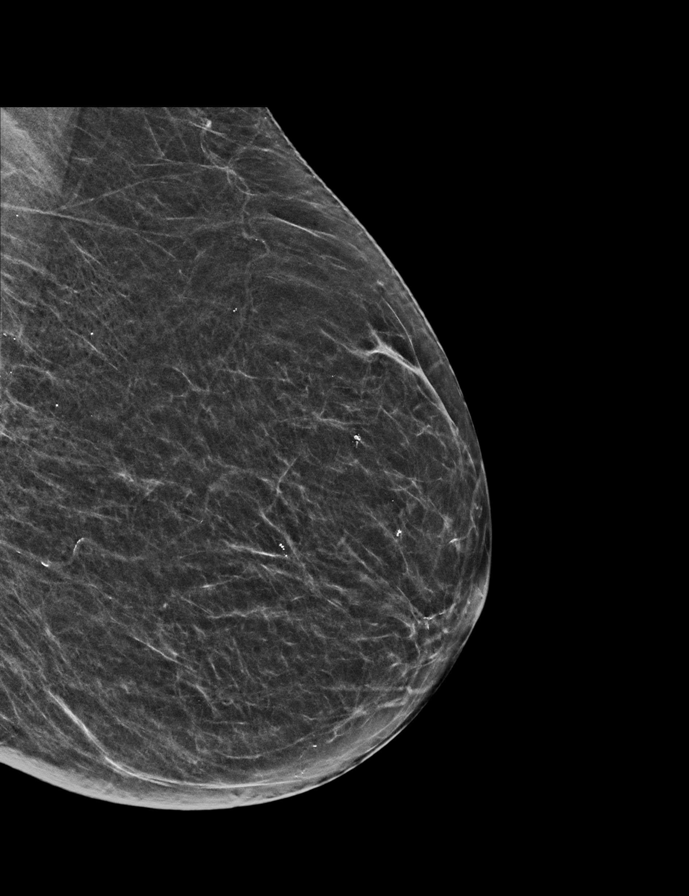

[R CC tomo · tomo slice 25/50.0]
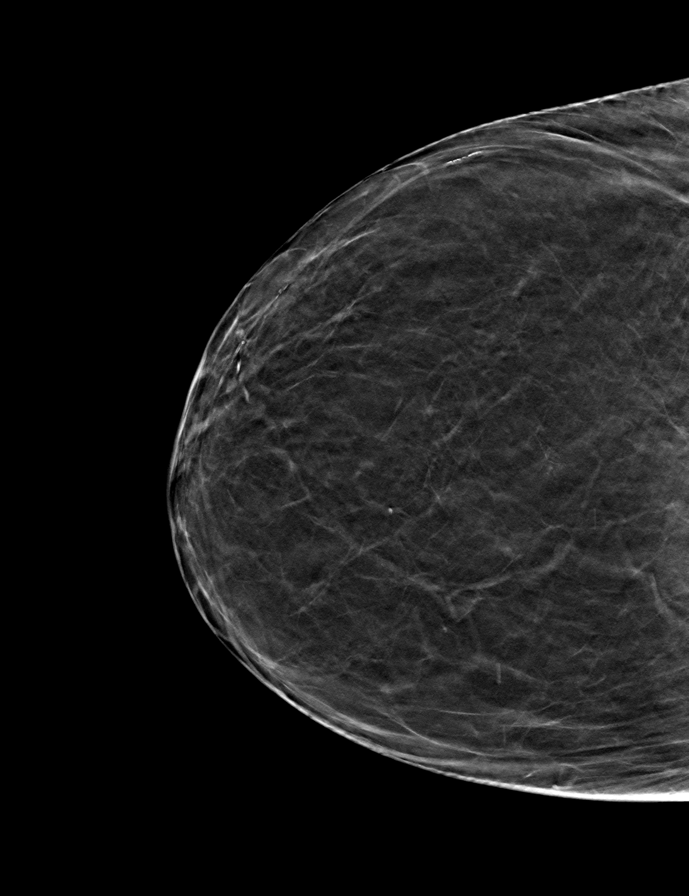

[9 of 28 positions shown; findings below may reference images not displayed]

ACR Breast Density Category b: There are scattered areas of
fibroglandular density.
FINDINGS: There are no findings suspicious for malignancy. Images were
processed with CAD.
IMPRESSION: No mammographic evidence of malignancy. A result letter of this
screening mammogram will be mailed directly to the patient.

RECOMMENDATION:
Screening mammogram in one year. (Code:[33])

BI-RADS CATEGORY  1: Negative.

## 2016-10-04 ENCOUNTER — Other Ambulatory Visit: Payer: Self-pay | Admitting: Internal Medicine

## 2016-10-04 DIAGNOSIS — Z1231 Encounter for screening mammogram for malignant neoplasm of breast: Secondary | ICD-10-CM

## 2016-10-24 ENCOUNTER — Ambulatory Visit
Admission: RE | Admit: 2016-10-24 | Discharge: 2016-10-24 | Disposition: A | Discharge status: Home / Self Care | Payer: Medicare Other | Source: Ambulatory Visit | Attending: Internal Medicine | Admitting: Internal Medicine

## 2016-10-24 DIAGNOSIS — Z1231 Encounter for screening mammogram for malignant neoplasm of breast: Secondary | ICD-10-CM

## 2017-04-19 ENCOUNTER — Ambulatory Visit
Admission: RE | Admit: 2017-04-19 | Discharge: 2017-04-19 | Disposition: A | Discharge status: Home / Self Care | Payer: Medicare Other | Source: Ambulatory Visit | Attending: Internal Medicine | Admitting: Internal Medicine

## 2017-04-19 ENCOUNTER — Other Ambulatory Visit: Payer: Self-pay | Admitting: Internal Medicine

## 2017-04-19 DIAGNOSIS — J189 Pneumonia, unspecified organism: Secondary | ICD-10-CM

## 2017-04-19 IMAGING — CR DG CHEST 2V
2 series · 2 of 2 positions shown · non-contrast
Comparison: [DATE]

CLINICAL DATA: Persistent cough for 10 days, concern for pneumonia

EXAM:
CHEST - 2 VIEW

[w chest pa]
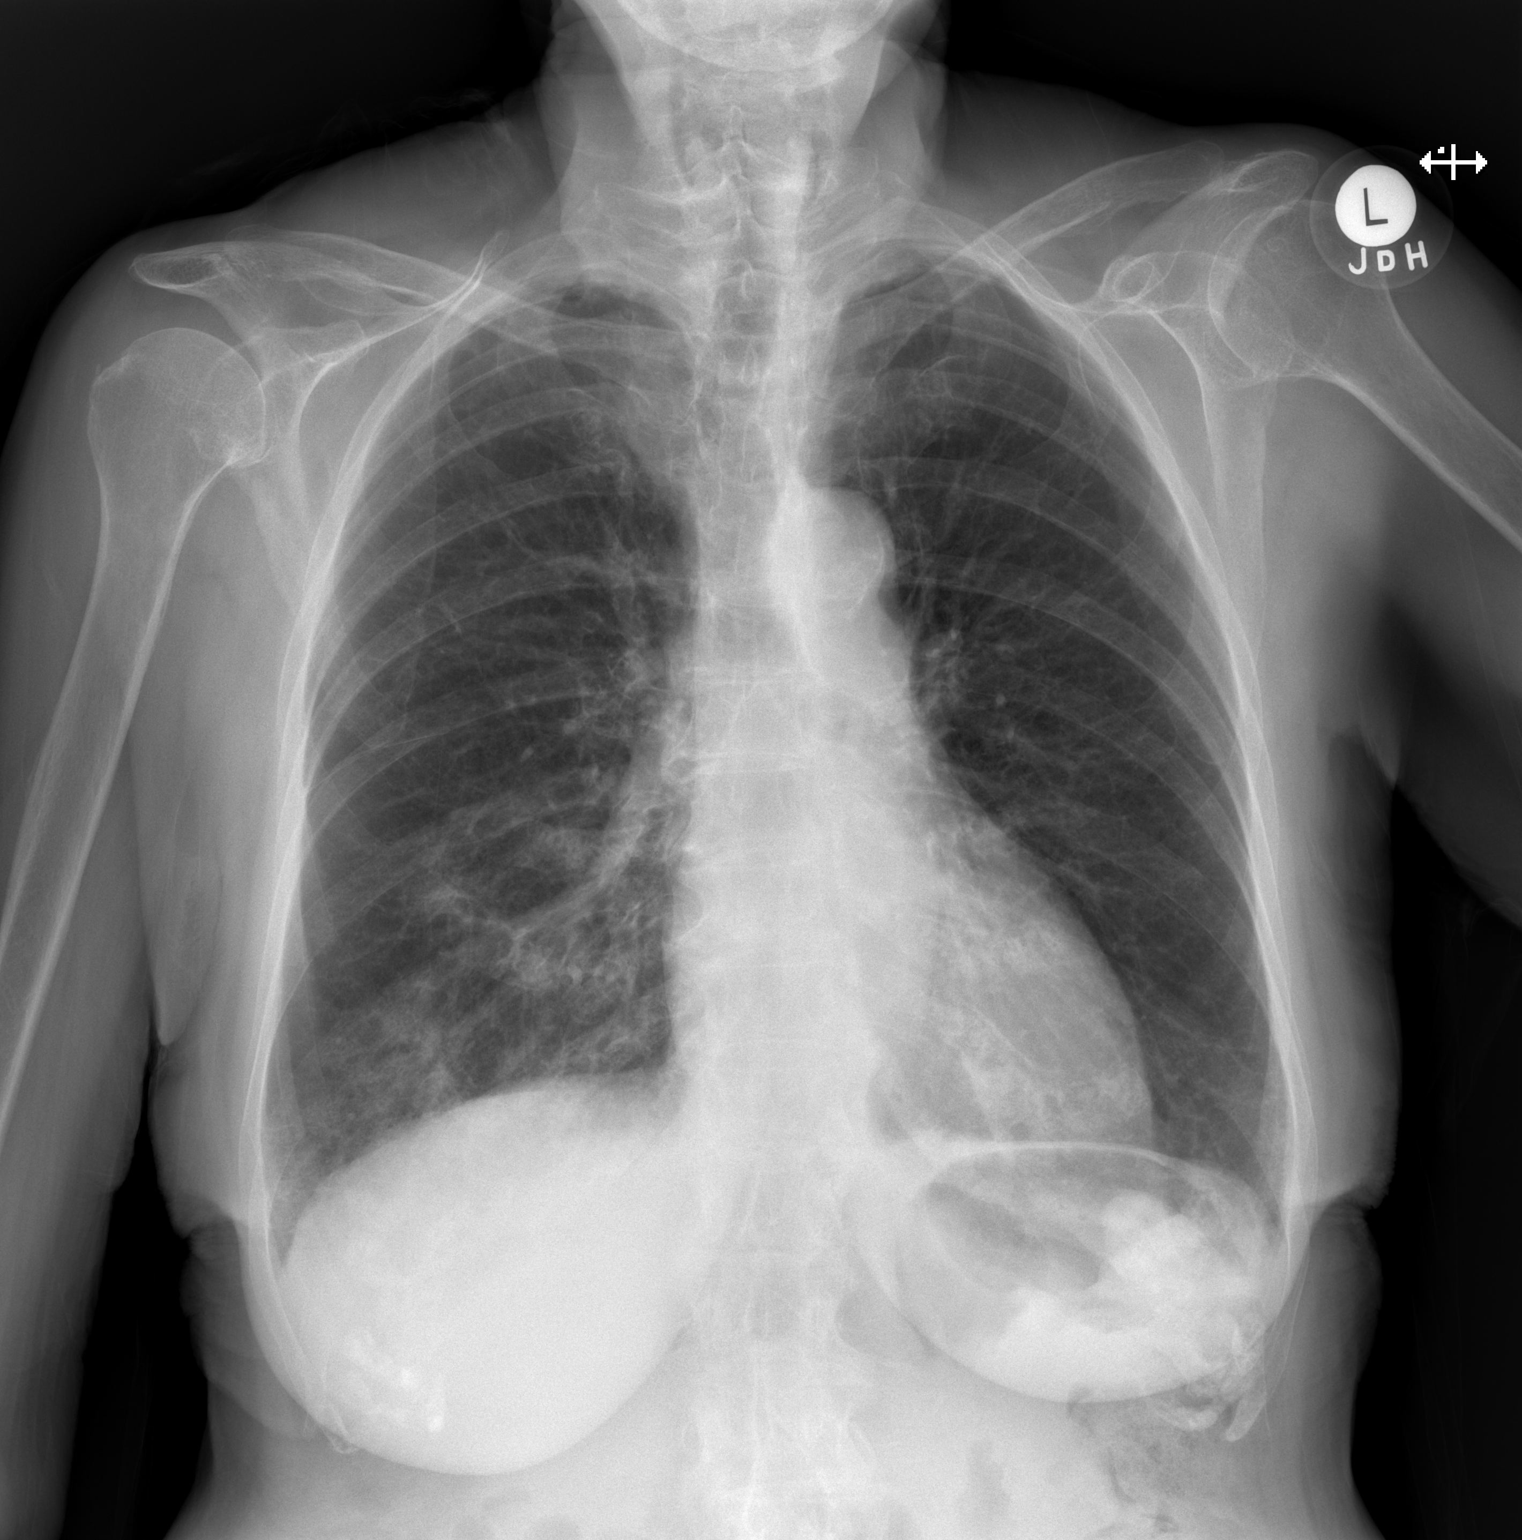

[w chest lat]
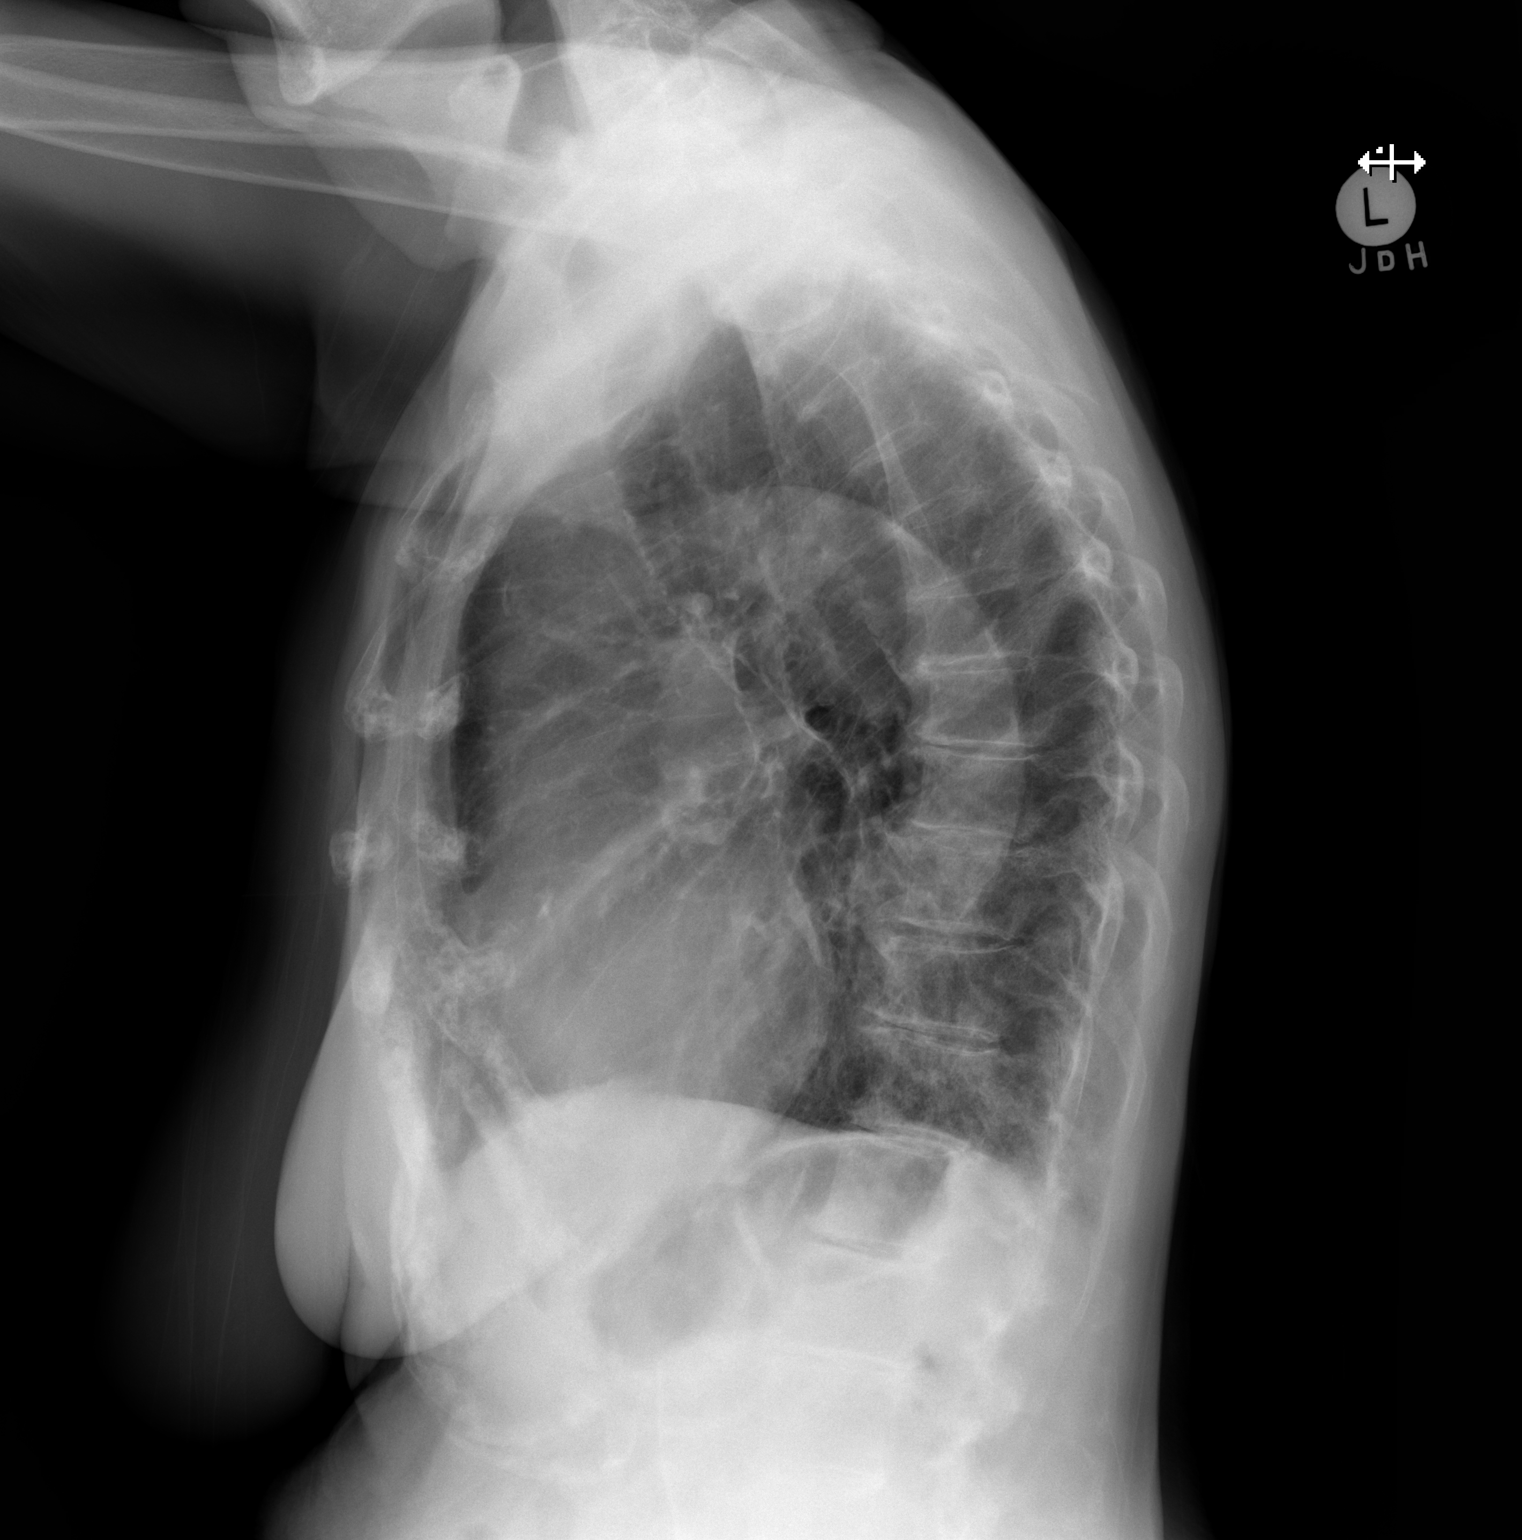

[2 of 2 positions shown; findings below may reference images not displayed]

FINDINGS: Background COPD/emphysema noted with parenchymal scarring,
bronchitic changes and mild hyperinflation. Mild increased right
basilar bronchovascular opacity which is posterior on the lateral
view, compatible with right basilar bronchopneumonia. Left lung
remains clear. No effusion or pneumothorax. Trachea is midline.
Normal heart size. Aorta is atherosclerotic. Degenerative changes
noted of the spine. Bones are osteopenic.
IMPRESSION: Mild right basilar bronchopneumonia.

Chronic COPD/emphysema pattern.

## 2017-05-31 ENCOUNTER — Ambulatory Visit
Admission: RE | Admit: 2017-05-31 | Discharge: 2017-05-31 | Disposition: A | Discharge status: Home / Self Care | Payer: Medicare Other | Source: Ambulatory Visit | Attending: Internal Medicine | Admitting: Internal Medicine

## 2017-05-31 ENCOUNTER — Other Ambulatory Visit: Payer: Self-pay | Admitting: Internal Medicine

## 2017-05-31 DIAGNOSIS — Z09 Encounter for follow-up examination after completed treatment for conditions other than malignant neoplasm: Secondary | ICD-10-CM

## 2017-05-31 IMAGING — CR DG CHEST 2V
2 series · 2 of 2 positions shown · non-contrast
Comparison: [DATE] and prior radiograph

CLINICAL DATA: Follow-up pneumonia.

EXAM:
CHEST - 2 VIEW

[w chest pa]
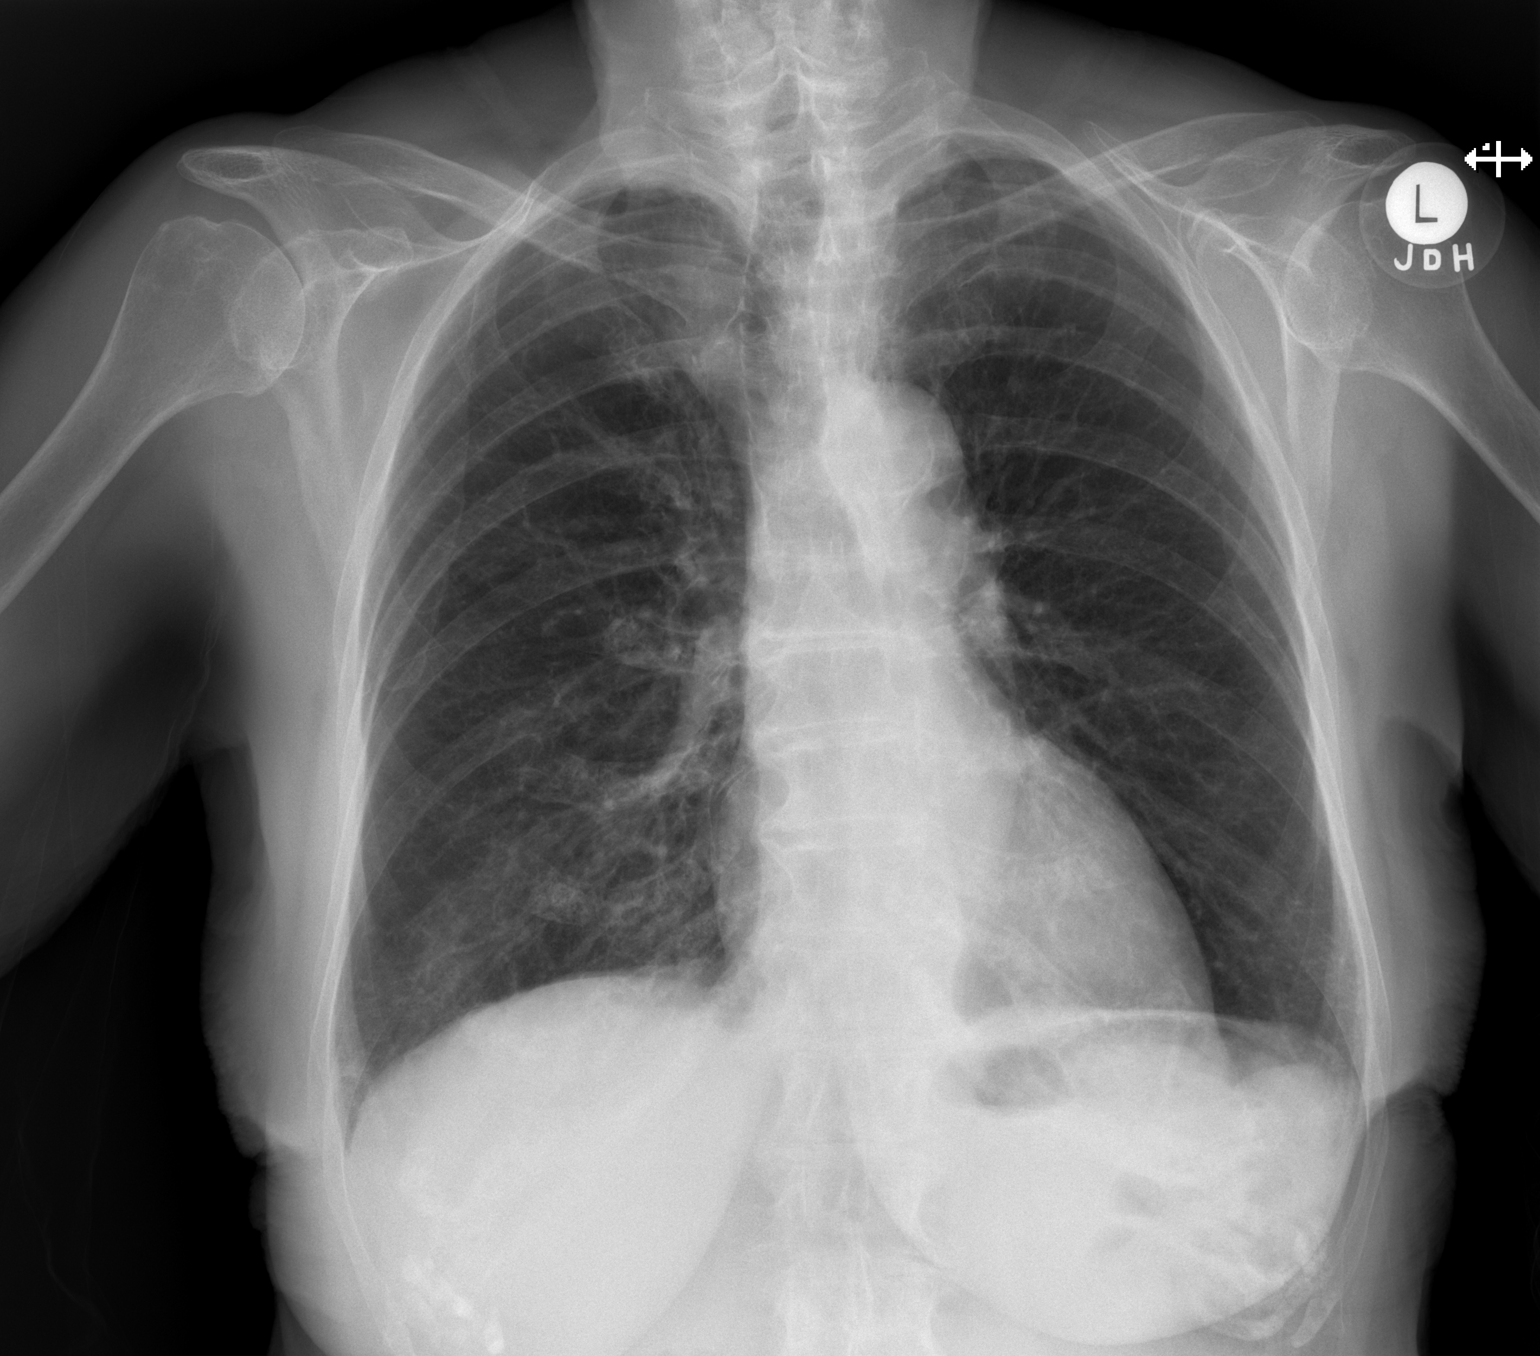

[w chest lat]
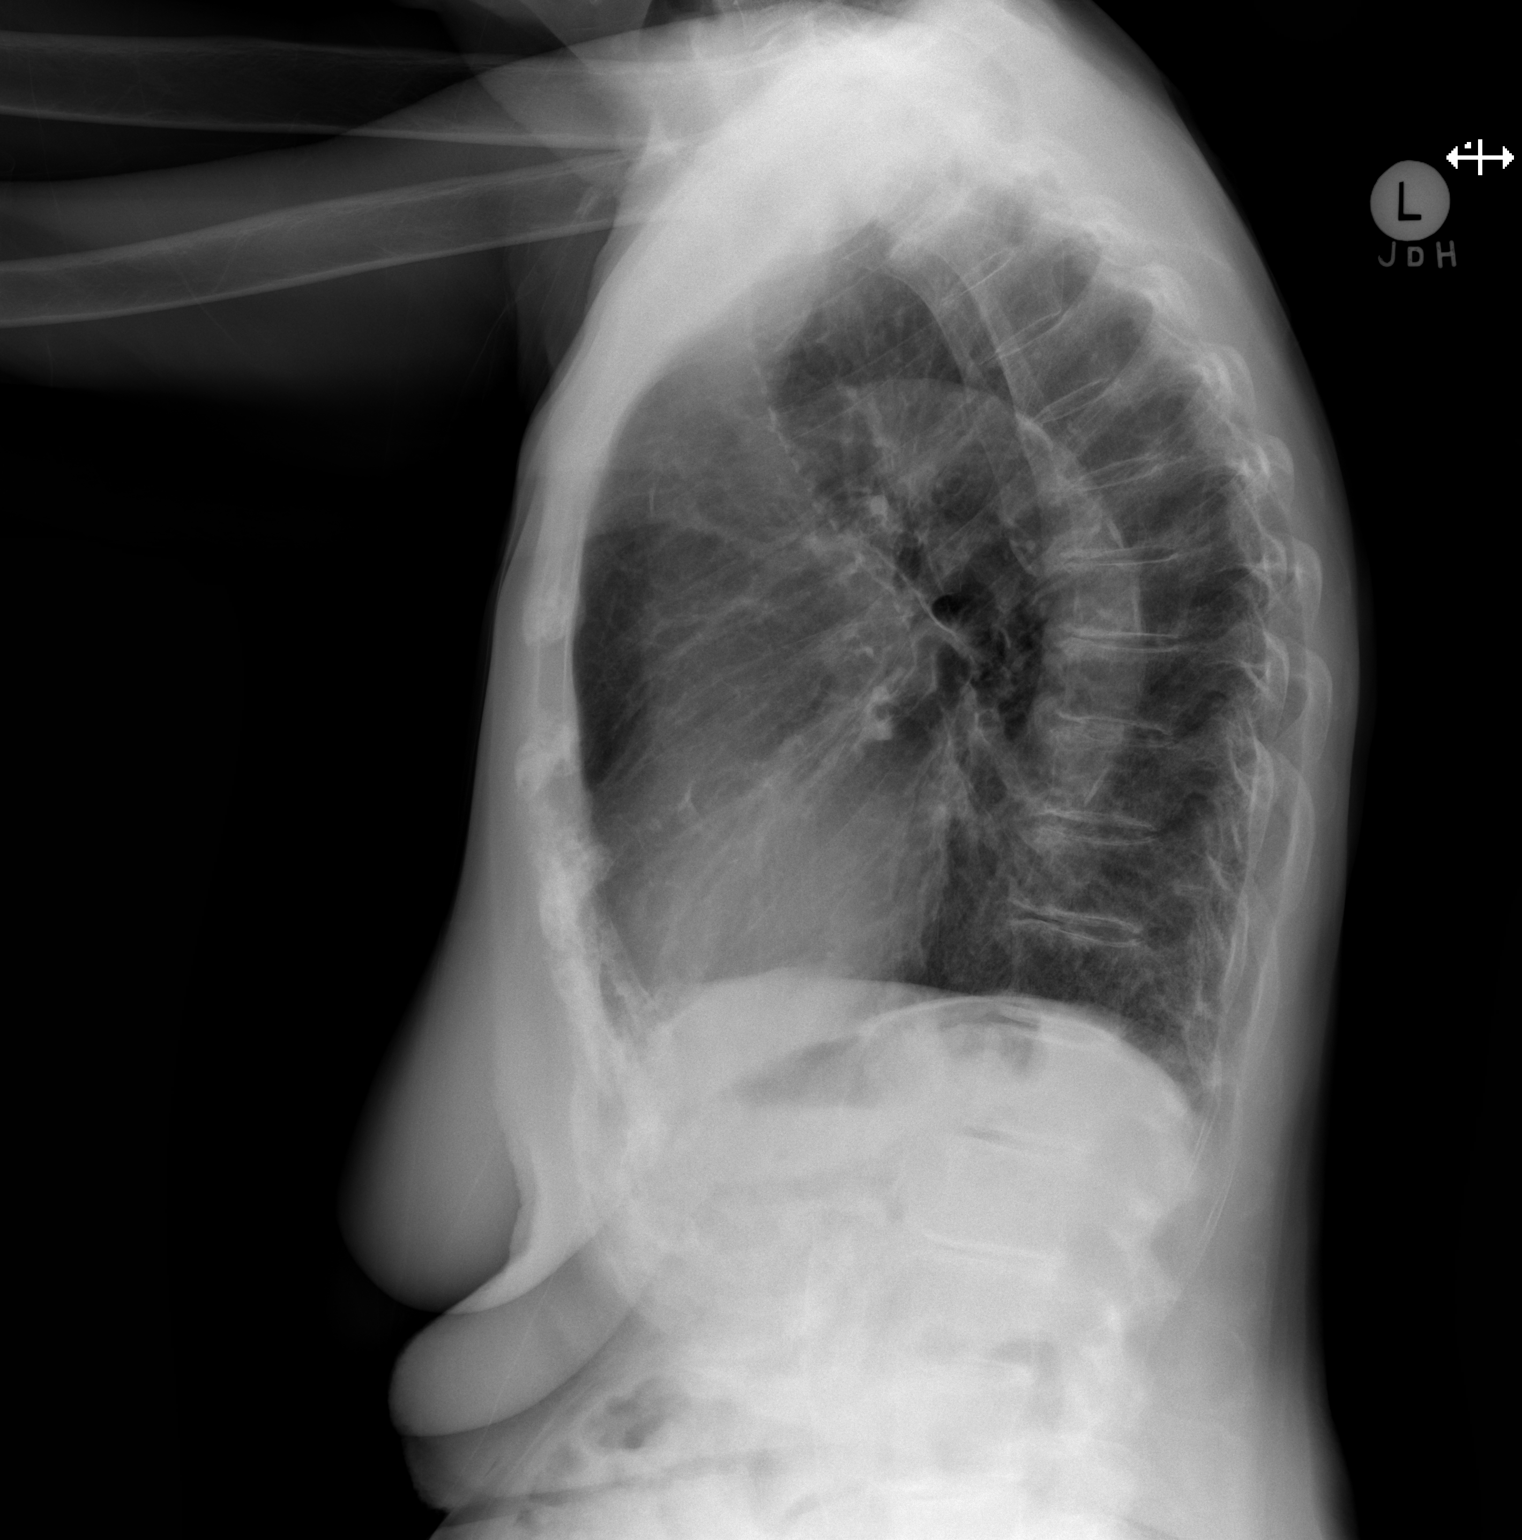

[2 of 2 positions shown; findings below may reference images not displayed]

FINDINGS: The cardiomediastinal silhouette is unremarkable.

Near complete resolution of RIGHT LOWER lobe airspace disease noted.

There is no evidence of pulmonary edema, suspicious pulmonary
nodule/mass, pleural effusion, or pneumothorax.

No acute bony abnormalities are identified.
IMPRESSION: Near complete resolution of RIGHT LOWER lobe airspace
disease/pneumonia.

## 2017-10-05 ENCOUNTER — Other Ambulatory Visit: Payer: Self-pay | Admitting: Internal Medicine

## 2017-10-05 DIAGNOSIS — Z1231 Encounter for screening mammogram for malignant neoplasm of breast: Secondary | ICD-10-CM

## 2017-11-09 ENCOUNTER — Ambulatory Visit
Admission: RE | Admit: 2017-11-09 | Discharge: 2017-11-09 | Disposition: A | Discharge status: Home / Self Care | Payer: Medicare Other | Source: Ambulatory Visit | Attending: Internal Medicine | Admitting: Internal Medicine

## 2017-11-09 DIAGNOSIS — Z1231 Encounter for screening mammogram for malignant neoplasm of breast: Secondary | ICD-10-CM

## 2017-11-09 IMAGING — MG DIGITAL SCREENING BILATERAL MAMMOGRAM WITH TOMO AND CAD
8 series · 8 of 24 positions shown · non-contrast
Comparison: Previous exam(s).

CLINICAL DATA: Screening.

EXAM:
DIGITAL SCREENING BILATERAL MAMMOGRAM WITH TOMO AND CAD

[L MLO synth-2D]
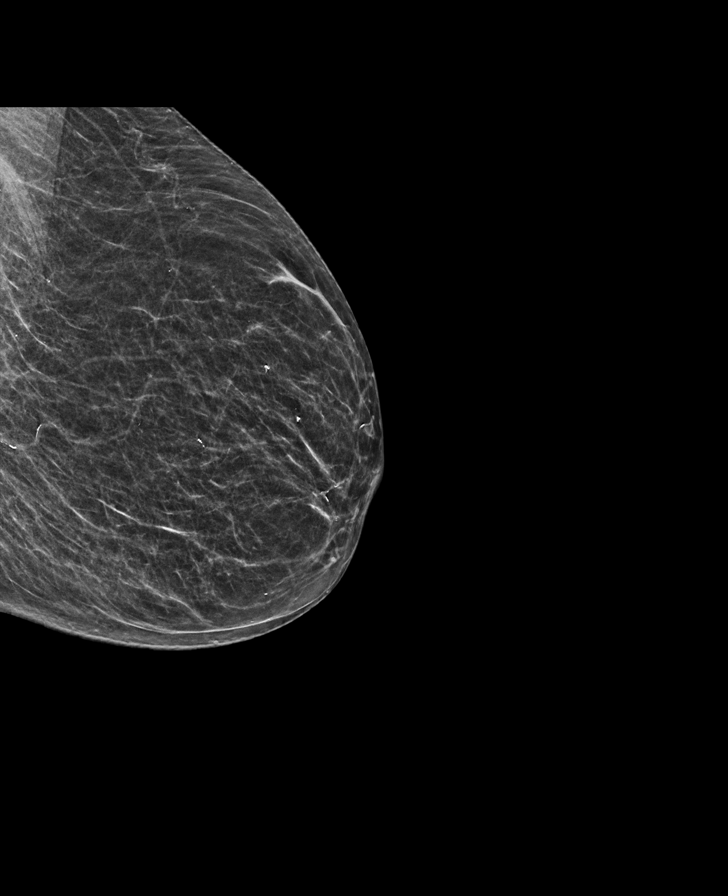

[R CC synth-2D]
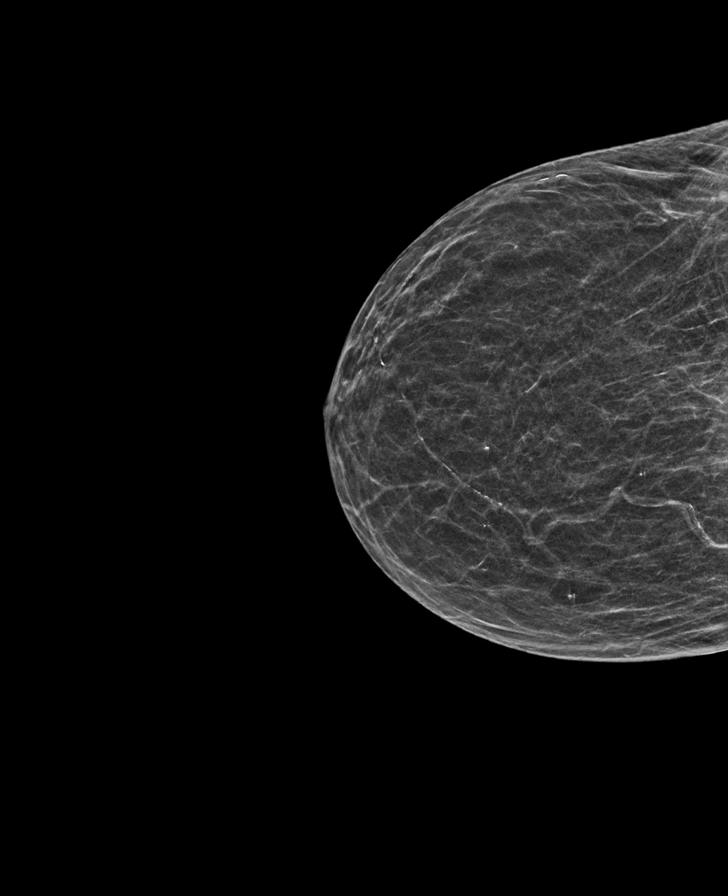

[R MLO synth-2D]
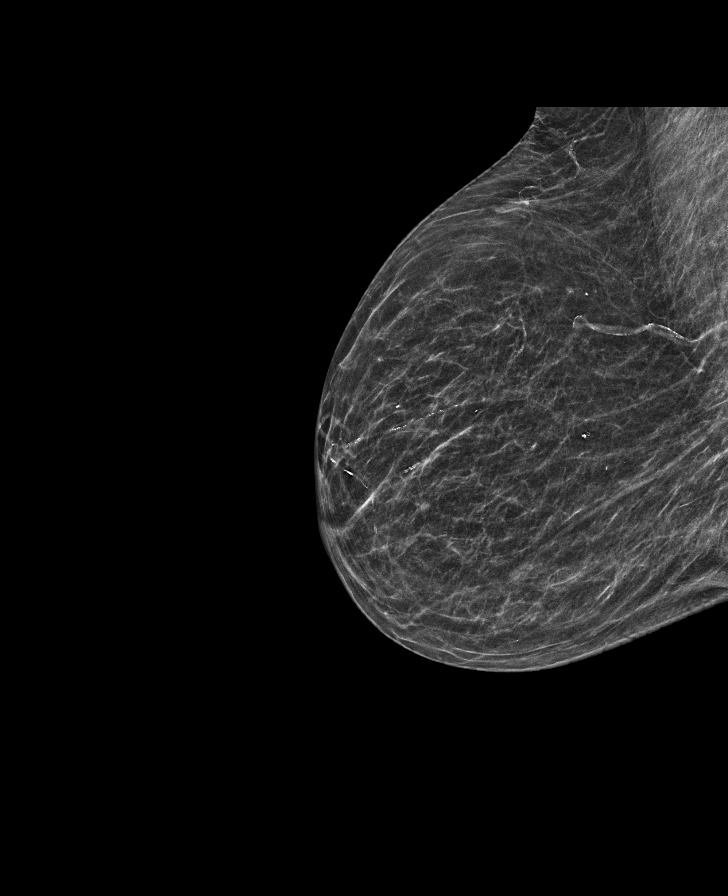

[L CC synth-2D]
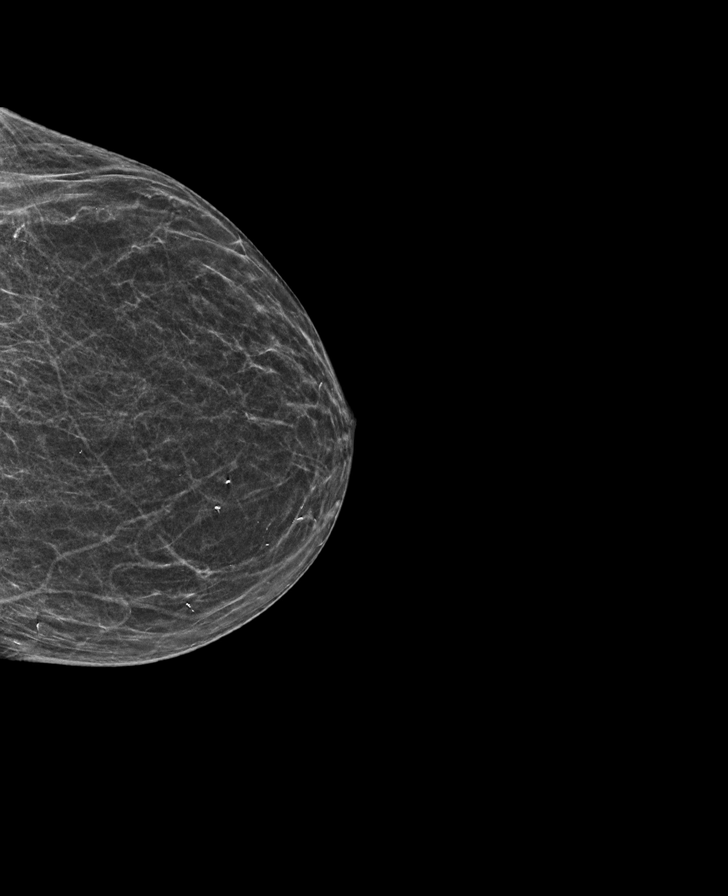

[R MLO tomo · tomo slice 24/47.0]
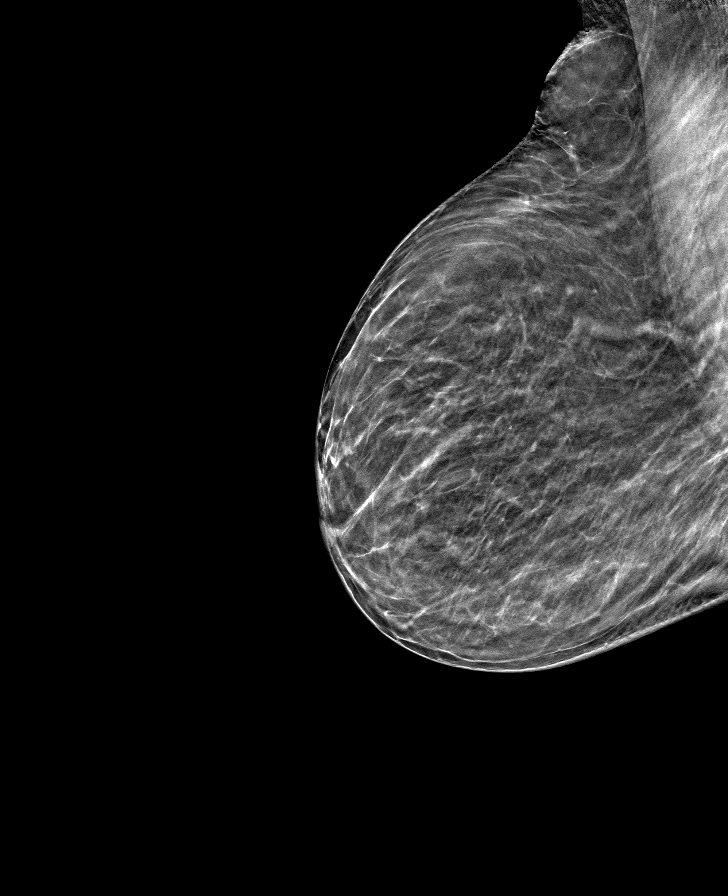

[L CC tomo · tomo slice 23/46.0]
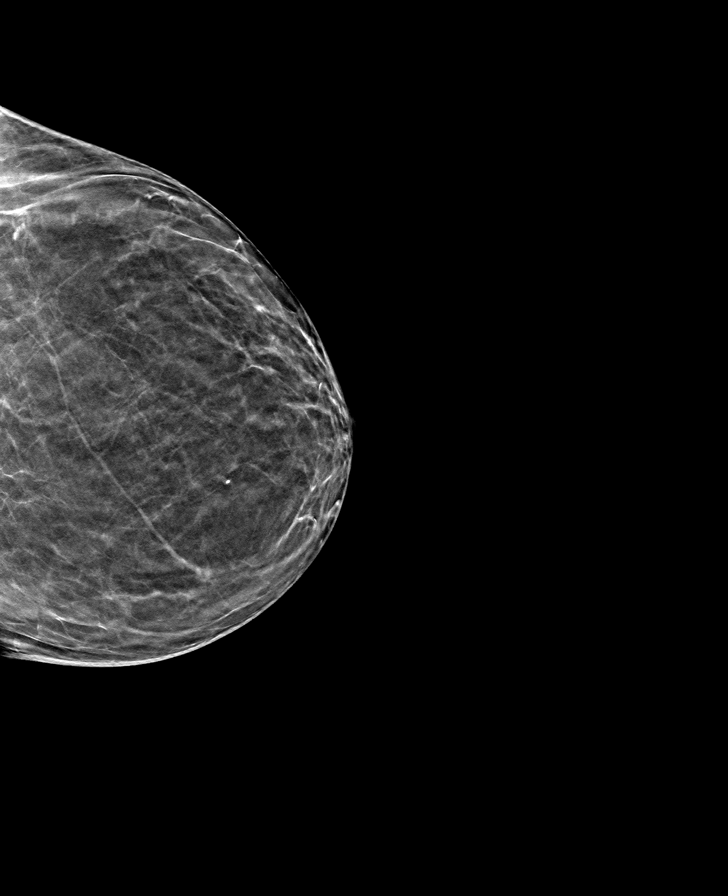

[R CC tomo · tomo slice 23/46.0]
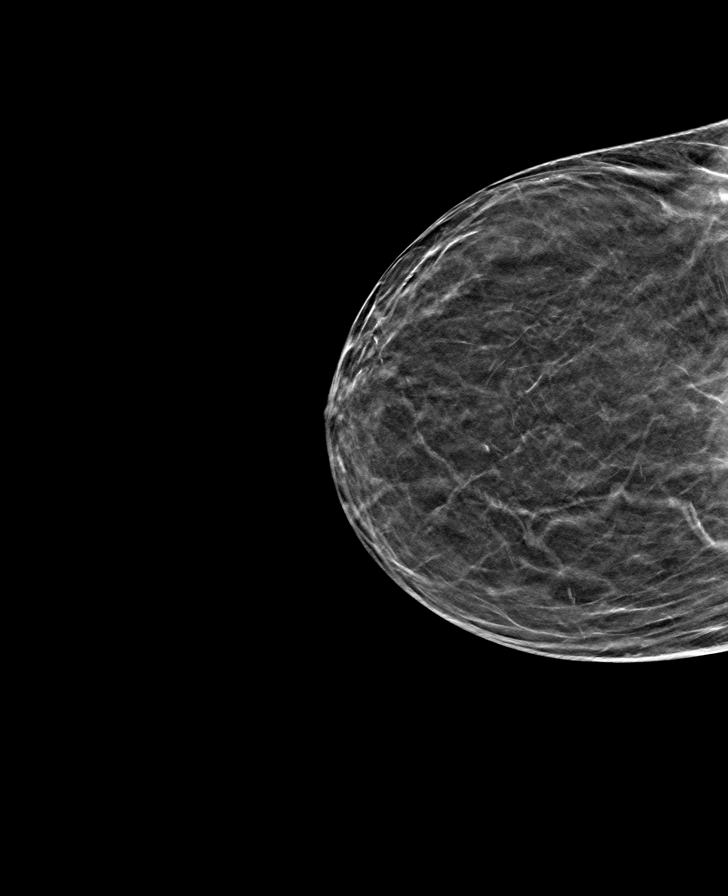

[L MLO tomo · tomo slice 24/47.0]
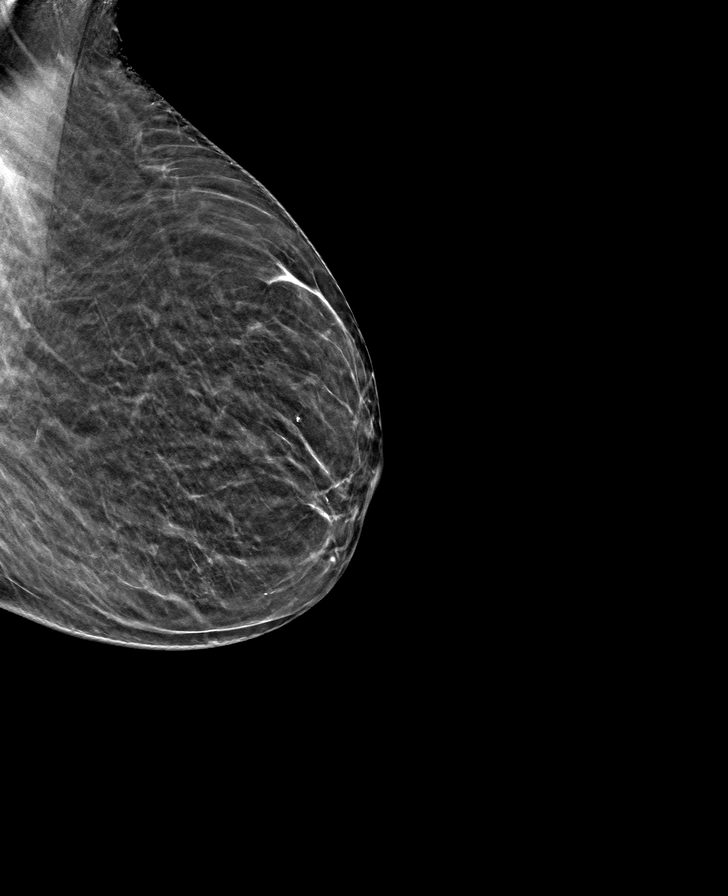

[8 of 24 positions shown; findings below may reference images not displayed]

ACR Breast Density Category b: There are scattered areas of
fibroglandular density.
FINDINGS: There are no findings suspicious for malignancy. Images were
processed with CAD.
IMPRESSION: No mammographic evidence of malignancy. A result letter of this
screening mammogram will be mailed directly to the patient.

RECOMMENDATION:
Screening mammogram in one year. (Code:[TQ])

BI-RADS CATEGORY  1: Negative.

## 2019-01-10 DIAGNOSIS — R269 Unspecified abnormalities of gait and mobility: Secondary | ICD-10-CM | POA: Insufficient documentation

## 2019-01-10 DIAGNOSIS — H9190 Unspecified hearing loss, unspecified ear: Secondary | ICD-10-CM | POA: Insufficient documentation

## 2019-01-10 DIAGNOSIS — I10 Essential (primary) hypertension: Secondary | ICD-10-CM | POA: Insufficient documentation

## 2019-05-17 ENCOUNTER — Other Ambulatory Visit (HOSPITAL_COMMUNITY): Payer: Self-pay | Admitting: Respiratory Therapy

## 2019-05-17 DIAGNOSIS — R0609 Other forms of dyspnea: Secondary | ICD-10-CM

## 2019-06-06 DIAGNOSIS — J302 Other seasonal allergic rhinitis: Secondary | ICD-10-CM | POA: Insufficient documentation

## 2019-08-01 ENCOUNTER — Ambulatory Visit (HOSPITAL_COMMUNITY)
Admission: RE | Admit: 2019-08-01 | Discharge: 2019-08-01 | Disposition: A | Discharge status: Home / Self Care | Payer: Medicare Other | Source: Ambulatory Visit | Attending: Internal Medicine | Admitting: Internal Medicine

## 2019-08-01 ENCOUNTER — Other Ambulatory Visit: Payer: Self-pay

## 2019-08-01 DIAGNOSIS — R0600 Dyspnea, unspecified: Secondary | ICD-10-CM | POA: Insufficient documentation

## 2019-08-01 LAB — PULMONARY FUNCTION TEST
DL/VA: 2.84 ml/min/mmHg/L
DLCO unc: 8.85 ml/min/mmHg
FEF 25-75 Post: 1.54 L/sec
FEF 25-75 Pre: 1.39 L/sec
FEF2575-%Change-Post: 10 %
FEF2575-%Pred-Post: 257 %
FEF2575-%Pred-Pre: 232 %
FEV1-%Change-Post: 7 %
FEV1-%Pred-Post: 132 %
FEV1-%Pred-Pre: 123 %
FEV1-Post: 1.76 L
FEV1-Pre: 1.63 L
FEV1FVC-%Change-Post: 10 %
FEV1FVC-%Pred-Pre: 106 %
FEV6-%Change-Post: -3 %
FEV6-%Pred-Post: 125 %
FEV6-%Pred-Pre: 129 %
FEV6-Post: 2.1 L
FEV6-Pre: 2.17 L
FEV6FVC-%Pred-Post: 108 %
FEV6FVC-%Pred-Pre: 108 %
FVC-%Change-Post: -2 %
FVC-%Pred-Post: 116 %
FVC-%Pred-Pre: 119 %
FVC-Post: 2.12 L
FVC-Pre: 2.17 L
Post FEV1/FVC ratio: 83 %
Post FEV6/FVC ratio: 100 %
Pre FEV1/FVC ratio: 75 %
Pre FEV6/FVC Ratio: 100 %
RV % pred: 145 %
RV: 3.8 L
TLC % pred: 120 %
TLC: 5.92 L

## 2019-08-01 MED ORDER — ALBUTEROL SULFATE (2.5 MG/3ML) 0.083% IN NEBU
2.5000 mg | INHALATION_SOLUTION | Freq: Once | RESPIRATORY_TRACT | Status: AC
  Administered 2019-08-01: 2.5 mg via RESPIRATORY_TRACT

## 2019-10-10 ENCOUNTER — Encounter (HOSPITAL_COMMUNITY): Payer: Self-pay

## 2019-10-10 ENCOUNTER — Other Ambulatory Visit: Payer: Self-pay

## 2019-10-10 ENCOUNTER — Emergency Department (HOSPITAL_COMMUNITY)
Admission: EM | Admit: 2019-10-10 | Discharge: 2019-10-10 | Disposition: A | Discharge status: Home / Self Care | Payer: Medicare Other | Attending: Emergency Medicine | Admitting: Emergency Medicine

## 2019-10-10 DIAGNOSIS — R42 Dizziness and giddiness: Secondary | ICD-10-CM | POA: Diagnosis not present

## 2019-10-10 DIAGNOSIS — I10 Essential (primary) hypertension: Secondary | ICD-10-CM | POA: Diagnosis not present

## 2019-10-10 DIAGNOSIS — Z87891 Personal history of nicotine dependence: Secondary | ICD-10-CM | POA: Diagnosis not present

## 2019-10-10 HISTORY — DX: Essential (primary) hypertension: I10

## 2019-10-10 HISTORY — DX: Unspecified osteoarthritis, unspecified site: M19.90

## 2019-10-10 LAB — CBC WITH DIFFERENTIAL/PLATELET
Abs Immature Granulocytes: 0.03 10*3/uL (ref 0.00–0.07)
Basophils Absolute: 0 10*3/uL (ref 0.0–0.1)
Basophils Relative: 1 %
Eosinophils Absolute: 0.1 10*3/uL (ref 0.0–0.5)
Eosinophils Relative: 1 %
HCT: 37.8 % (ref 36.0–46.0)
Hemoglobin: 12.3 g/dL (ref 12.0–15.0)
Immature Granulocytes: 1 %
Lymphocytes Relative: 12 %
Lymphs Abs: 0.6 10*3/uL — ABNORMAL LOW (ref 0.7–4.0)
MCH: 30.5 pg (ref 26.0–34.0)
MCHC: 32.5 g/dL (ref 30.0–36.0)
MCV: 93.8 fL (ref 80.0–100.0)
Monocytes Absolute: 0.2 10*3/uL (ref 0.1–1.0)
Monocytes Relative: 4 %
Neutro Abs: 4.4 10*3/uL (ref 1.7–7.7)
Neutrophils Relative %: 81 %
Platelets: 220 10*3/uL (ref 150–400)
RBC: 4.03 MIL/uL (ref 3.87–5.11)
RDW: 13.5 % (ref 11.5–15.5)
WBC: 5.4 10*3/uL (ref 4.0–10.5)
nRBC: 0 % (ref 0.0–0.2)

## 2019-10-10 LAB — COMPREHENSIVE METABOLIC PANEL
ALT: 11 U/L (ref 0–44)
AST: 23 U/L (ref 15–41)
Albumin: 3.9 g/dL (ref 3.5–5.0)
Alkaline Phosphatase: 84 U/L (ref 38–126)
Anion gap: 10 (ref 5–15)
BUN: 15 mg/dL (ref 8–23)
CO2: 26 mmol/L (ref 22–32)
Calcium: 8.8 mg/dL — ABNORMAL LOW (ref 8.9–10.3)
Chloride: 104 mmol/L (ref 98–111)
Creatinine, Ser: 0.6 mg/dL (ref 0.44–1.00)
GFR calc non Af Amer: >60 mL/min (ref >60)
Glucose, Bld: 118 mg/dL — ABNORMAL HIGH (ref 70–99)
Potassium: 3.4 mmol/L — ABNORMAL LOW (ref 3.5–5.1)
Sodium: 140 mmol/L (ref 135–145)
Total Bilirubin: 0.8 mg/dL (ref 0.3–1.2)
Total Protein: 7 g/dL (ref 6.5–8.1)

## 2019-10-10 LAB — LIPASE, BLOOD: Lipase: 38 U/L (ref 11–51)

## 2019-10-10 MED ORDER — MECLIZINE HCL 25 MG PO TABS
12.5000 mg | ORAL_TABLET | Freq: Once | ORAL | Status: AC
  Administered 2019-10-10: 12.5 mg via ORAL
  Filled 2019-10-10: qty 1

## 2019-10-10 MED ORDER — MECLIZINE HCL 12.5 MG PO TABS
12.5000 mg | ORAL_TABLET | Freq: Three times a day (TID) | ORAL | 0 refills | Status: AC | PRN
Start: 2019-10-10 — End: ?

## 2019-10-10 MED ORDER — SODIUM CHLORIDE 0.9 % IV BOLUS
500.0000 mL | Freq: Once | INTRAVENOUS | Status: AC
  Administered 2019-10-10: 500 mL via INTRAVENOUS

## 2019-10-10 NOTE — ED Provider Notes (Signed)
[facility]-EMERGENCY DEPT Provider Note   CSN: [id number] Arrival date & time: 10/10/19  0746     History Chief Complaint  Patient presents with  . Dizziness  . Nausea  . Emesis    Shelley Sloan is a 86 y.o. female.  86 year old female presents with sudden onset of dizziness which occurred when she got to go to bathroom.  States he feels that the room is spinning around.  Does have remote history of vertigo.  Denies any focal weakness.  Denies any abdominal discomfort.  No urinary symptoms.  No fever or chills.  Symptoms worse with moving her head and better with remaining still.  Called EMS and was transported here.        Past Medical History:  Diagnosis Date  . Arthritis   . Hypertension     There are no problems to display for this patient.   Past Surgical History:  Procedure Laterality Date  . ABDOMINAL HYSTERECTOMY    . JOINT REPLACEMENT Right 1995  . JOINT REPLACEMENT Left 1997     OB History   No obstetric history on file.     History reviewed. No pertinent family history.  Social History   Tobacco Use  . Smoking status: Former Smoker    Quit date: 1975    Years since quitting: 46.7  . Smokeless tobacco: Never Used  Substance Use Topics  . Alcohol use: Yes  . Drug use: Never    Home Medications Prior to Admission medications   Not on File    Allergies    Patient has no allergy information on record.  Review of Systems   Review of Systems  All other systems reviewed and are negative.   Physical Exam Updated Vital Signs BP (!) 173/61 (BP Location: Right Arm)   Pulse 71   Resp 16   Ht 1.6 m (5\' 3" )   Wt 54.4 kg   SpO2 100%   BMI 21.26 kg/m   Physical Exam Vitals and nursing note reviewed.  Constitutional:      General: She is not in acute distress.    Appearance: Normal appearance. She is well-developed. She is not toxic-appearing.  HENT:     Head: Normocephalic and atraumatic.  Eyes:     General:  Lids are normal.     Conjunctiva/sclera: Conjunctivae normal.     Pupils: Pupils are equal, round, and reactive to light.  Neck:     Thyroid: No thyroid mass.     Trachea: No tracheal deviation.  Cardiovascular:     Rate and Rhythm: Normal rate and regular rhythm.     Heart sounds: Normal heart sounds. No murmur heard.  No gallop.   Pulmonary:     Effort: Pulmonary effort is normal. No respiratory distress.     Breath sounds: Normal breath sounds. No stridor. No decreased breath sounds, wheezing, rhonchi or rales.  Abdominal:     General: Bowel sounds are normal. There is no distension.     Palpations: Abdomen is soft.     Tenderness: There is no abdominal tenderness. There is no rebound.  Musculoskeletal:        General: No tenderness. Normal range of motion.     Cervical back: Normal range of motion and neck supple.  Skin:    General: Skin is warm and dry.     Findings: No abrasion or rash.  Neurological:     General: No focal deficit present.     Mental Status:  She is alert and oriented to person, place, and time.     GCS: GCS eye subscore is 4. GCS verbal subscore is 5. GCS motor subscore is 6.     Cranial Nerves: No cranial nerve deficit.     Sensory: No sensory deficit.     Coordination: Coordination is intact.     Comments: Horizontal nystagmus noted  Psychiatric:        Speech: Speech normal.        Behavior: Behavior normal.     ED Results / Procedures / Treatments   Labs (all labs ordered are listed, but only abnormal results are displayed) Labs Reviewed  CBC WITH DIFFERENTIAL/PLATELET  COMPREHENSIVE METABOLIC PANEL  LIPASE, BLOOD    EKG None  Radiology No results found.  Procedures Procedures (including critical care time)  Medications Ordered in ED Medications  sodium chloride 0.9 % bolus 500 mL (has no administration in time range)  meclizine (ANTIVERT) tablet 12.5 mg (has no administration in time range)    ED Course  I have reviewed the  triage vital signs and the nursing notes.  Pertinent labs & imaging results that were available during my care of the patient were reviewed by me and considered in my medical decision making (see chart for details).    MDM Rules/Calculators/A&P                          Patient given Antivert and IV fluids and feels better.  Labs are reassuring here.  Suspect peripheral vertigo.  Very low suspicion for central process.  Will discharge home Final Clinical Impression(s) / ED Diagnoses Final diagnoses:  None    Rx / DC Orders ED Discharge Orders    None       Lorre Nick, MD 10/10/19 1204

## 2019-10-10 NOTE — ED Notes (Signed)
Patient cleaned and put into gown with clean depend on. Pt placed on purewick.

## 2019-10-10 NOTE — ED Triage Notes (Signed)
Pt woke up at 4am to use restroom and experienced NVD and dizziness. Denies diarrhea. Denies fall. Called for assistance, who then called EMS. Think she could have come into contact with bad food. Pt sometimes has vertigo but not this bad. Received NS and 4mg  zofran. Some urinary and fecal incontinence en route.   BP 184/92 HR 80s RR 16-20 SpO2 99% RA CBG 134 Temp 97.6   ekg NSR 20ga LAC

## 2019-10-10 NOTE — ED Notes (Signed)
Pt c/o cramps in right leg that stopped, then cramps in left calf and foot. Dangled left leg off bed for 93m and had pt flex. Cramps diminished. Repositioned onto right side.

## 2019-11-18 ENCOUNTER — Ambulatory Visit (INDEPENDENT_AMBULATORY_CARE_PROVIDER_SITE_OTHER): Payer: Medicare Other | Admitting: Otolaryngology

## 2019-11-19 ENCOUNTER — Other Ambulatory Visit: Payer: Self-pay

## 2019-11-19 ENCOUNTER — Encounter (INDEPENDENT_AMBULATORY_CARE_PROVIDER_SITE_OTHER): Payer: Self-pay | Admitting: Otolaryngology

## 2019-11-19 ENCOUNTER — Ambulatory Visit (INDEPENDENT_AMBULATORY_CARE_PROVIDER_SITE_OTHER): Payer: Medicare Other | Admitting: Otolaryngology

## 2019-11-19 VITALS — Temp 97.2°F

## 2019-11-19 DIAGNOSIS — H8120 Vestibular neuronitis, unspecified ear: Secondary | ICD-10-CM

## 2019-11-19 DIAGNOSIS — R42 Dizziness and giddiness: Secondary | ICD-10-CM | POA: Diagnosis not present

## 2019-11-19 DIAGNOSIS — H6123 Impacted cerumen, bilateral: Secondary | ICD-10-CM

## 2019-11-19 DIAGNOSIS — H903 Sensorineural hearing loss, bilateral: Secondary | ICD-10-CM | POA: Diagnosis not present

## 2019-11-19 NOTE — Progress Notes (Signed)
HPI: Shelley Sloan is a 86 y.o. female who presents is referred by her PCP for evaluation of dizziness.  Patient lives at friend's home and is been unsteady on her feet for for a while and uses a cane.  But apparently a couple weeks ago when she was getting up in the middle of the night to use the restroom she developed acute onset of vertigo with spinning sensation.  She has not had this vertigo previously.  She made it back to bed and went back to sleep but when she got up a couple of hours later she had another episode of vertigo at which point they took her to the ED.  By the time she got to the ED the vertigo had subsided..  She apparently was treated with IV fluids and meclizine and was doing much better and transferred back to friend's home.  She has had no further episodes of vertigo or spinning sensation although she has been a little bit more off balance. She also states that she feels like her blood pressure medication makes her feel little lightheaded. She has not noticed any change in her hearing.  She wears bilateral hearing aids.  Past Medical History:  Diagnosis Date  . Arthritis   . Hypertension    Past Surgical History:  Procedure Laterality Date  . ABDOMINAL HYSTERECTOMY    . JOINT REPLACEMENT Right 1995  . JOINT REPLACEMENT Left 1997   Social History   Socioeconomic History  . Marital status: Married    Spouse name: Not on file  . Number of children: Not on file  . Years of education: Not on file  . Highest education level: Not on file  Occupational History  . Not on file  Tobacco Use  . Smoking status: Former Smoker    Packs/day: 0.50    Years: 18.00    Pack years: 9.00    Quit date: 1975    Years since quitting: 46.9  . Smokeless tobacco: Never Used  Substance and Sexual Activity  . Alcohol use: Yes  . Drug use: Never  . Sexual activity: Not on file  Other Topics Concern  . Not on file  Social History Narrative  . Not on file   Social Determinants  of Health   Financial Resource Strain:   . Difficulty of Paying Living Expenses: Not on file  Food Insecurity:   . Worried About Programme researcher, broadcasting/film/video in the Last Year: Not on file  . Ran Out of Food in the Last Year: Not on file  Transportation Needs:   . Lack of Transportation (Medical): Not on file  . Lack of Transportation (Non-Medical): Not on file  Physical Activity:   . Days of Exercise per Week: Not on file  . Minutes of Exercise per Session: Not on file  Stress:   . Feeling of Stress : Not on file  Social Connections:   . Frequency of Communication with Friends and Family: Not on file  . Frequency of Social Gatherings with Friends and Family: Not on file  . Attends Religious Services: Not on file  . Active Member of Clubs or Organizations: Not on file  . Attends Banker Meetings: Not on file  . Marital Status: Not on file   No family history on file. Allergies  Allergen Reactions  . Sulfa Antibiotics Hives   Prior to Admission medications   Medication Sig Start Date End Date Taking? Authorizing Provider  amLODipine (NORVASC) 5 MG tablet  Take 5 mg by mouth at bedtime.  09/05/19  Yes [provider]  aspirin EC 81 MG tablet Take 81 mg by mouth daily. Swallow whole.   Yes [provider]  cholecalciferol (VITAMIN D3) 25 MCG (1000 UNIT) tablet Take 1,000 Units by mouth daily.   Yes [provider]  levothyroxine (SYNTHROID) 50 MCG tablet Take 50 mcg by mouth daily. 08/01/19  Yes [provider]  meclizine (ANTIVERT) 12.5 MG tablet Take 1 tablet (12.5 mg total) by mouth 3 (three) times daily as needed for dizziness. 10/10/19  Yes Lorre Nick, MD  Multiple Vitamins-Minerals (ICAPS AREDS 2 PO) Take 1 capsule by mouth daily.   Yes [provider]     Positive ROS: Otherwise negative  All other systems have been reviewed and were otherwise negative with the exception of those mentioned in the HPI and as above.  Physical  Exam: Constitutional: Alert, well-appearing, no acute distress Ears: External ears without lesions or tenderness.  She had minimal wax buildup in both ears that was cleaned in the office today using forceps and curettes.  TMs are clear bilaterally with good mobility on pneumatic otoscopy.  On Dix-Hallpike testing in the office today she had no clinical evidence of BPPV. Nasal: External nose without lesions.. Clear nasal passages Oral: Lips and gums without lesions. Tongue and palate mucosa without lesions. Posterior oropharynx clear. Neck: No palpable adenopathy or masses Respiratory: Breathing comfortably  Skin: No facial/neck lesions or rash noted.  Cerumen impaction removal  Date/Time: 11/19/2019 2:36 PM Performed by: Drema Halon, MD Authorized by: Drema Halon, MD   Consent:    Consent obtained:  Verbal   Consent given by:  Patient   Risks discussed:  Pain and bleeding Procedure details:    Location:  L ear and R ear   Procedure type: curette and forceps   Post-procedure details:    Inspection:  TM intact and canal normal   Hearing quality:  Improved   Patient tolerance of procedure:  Tolerated well, no immediate complications Comments:     TMs are clear bilaterally.    Assessment: The episode of vertigo patient had a couple weeks ago was more consistent with vestibular neuronitis than BPPV. There is no primary ear abnormality noted on clinical exam in the office today and no evidence of BPPV in the office today  Plan: Did not recommend any specific medication to take. She might benefit by undergoing vestibular rehab with physical therapy or any exercise program at friends home that they may offer with assistance.   Narda Bonds, MD   CC:

## 2020-04-06 ENCOUNTER — Other Ambulatory Visit: Payer: Self-pay

## 2020-04-06 ENCOUNTER — Emergency Department (HOSPITAL_COMMUNITY): Payer: Medicare Other

## 2020-04-06 ENCOUNTER — Inpatient Hospital Stay (HOSPITAL_COMMUNITY): Payer: Medicare Other

## 2020-04-06 ENCOUNTER — Inpatient Hospital Stay (HOSPITAL_COMMUNITY)
Admission: EM | Admit: 2020-04-06 | Discharge: 2020-04-09 | DRG: 062 | Disposition: A | Discharge status: Skilled Nursing Facility | Payer: Medicare Other | Source: Skilled Nursing Facility | Attending: Neurology | Admitting: Neurology

## 2020-04-06 DIAGNOSIS — E785 Hyperlipidemia, unspecified: Secondary | ICD-10-CM | POA: Diagnosis present

## 2020-04-06 DIAGNOSIS — I6389 Other cerebral infarction: Secondary | ICD-10-CM

## 2020-04-06 DIAGNOSIS — Z90710 Acquired absence of both cervix and uterus: Secondary | ICD-10-CM | POA: Diagnosis not present

## 2020-04-06 DIAGNOSIS — G8194 Hemiplegia, unspecified affecting left nondominant side: Secondary | ICD-10-CM | POA: Diagnosis present

## 2020-04-06 DIAGNOSIS — Z79890 Hormone replacement therapy (postmenopausal): Secondary | ICD-10-CM

## 2020-04-06 DIAGNOSIS — Z20822 Contact with and (suspected) exposure to covid-19: Secondary | ICD-10-CM | POA: Diagnosis present

## 2020-04-06 DIAGNOSIS — Z87891 Personal history of nicotine dependence: Secondary | ICD-10-CM

## 2020-04-06 DIAGNOSIS — I10 Essential (primary) hypertension: Secondary | ICD-10-CM | POA: Diagnosis present

## 2020-04-06 DIAGNOSIS — I161 Hypertensive emergency: Secondary | ICD-10-CM | POA: Diagnosis present

## 2020-04-06 DIAGNOSIS — R29702 NIHSS score 2: Secondary | ICD-10-CM | POA: Diagnosis not present

## 2020-04-06 DIAGNOSIS — I639 Cerebral infarction, unspecified: Secondary | ICD-10-CM

## 2020-04-06 DIAGNOSIS — Z79899 Other long term (current) drug therapy: Secondary | ICD-10-CM

## 2020-04-06 DIAGNOSIS — I6381 Other cerebral infarction due to occlusion or stenosis of small artery: Secondary | ICD-10-CM | POA: Diagnosis present

## 2020-04-06 DIAGNOSIS — Z9282 Status post administration of tPA (rtPA) in a different facility within the last 24 hours prior to admission to current facility: Secondary | ICD-10-CM | POA: Diagnosis not present

## 2020-04-06 DIAGNOSIS — Z7982 Long term (current) use of aspirin: Secondary | ICD-10-CM | POA: Diagnosis not present

## 2020-04-06 DIAGNOSIS — R29701 NIHSS score 1: Secondary | ICD-10-CM | POA: Diagnosis present

## 2020-04-06 DIAGNOSIS — E039 Hypothyroidism, unspecified: Secondary | ICD-10-CM | POA: Diagnosis present

## 2020-04-06 DIAGNOSIS — E7800 Pure hypercholesterolemia, unspecified: Secondary | ICD-10-CM | POA: Diagnosis not present

## 2020-04-06 DIAGNOSIS — R42 Dizziness and giddiness: Secondary | ICD-10-CM | POA: Diagnosis not present

## 2020-04-06 LAB — I-STAT CHEM 8, ED
BUN: 28 mg/dL — ABNORMAL HIGH (ref 8–23)
Calcium, Ion: 0.99 mmol/L — ABNORMAL LOW (ref 1.15–1.40)
Chloride: 107 mmol/L (ref 98–111)
Creatinine, Ser: 0.6 mg/dL (ref 0.44–1.00)
Glucose, Bld: 102 mg/dL — ABNORMAL HIGH (ref 70–99)
HCT: 38 % (ref 36.0–46.0)
Hemoglobin: 12.9 g/dL (ref 12.0–15.0)
Potassium: 4 mmol/L (ref 3.5–5.1)
Sodium: 140 mmol/L (ref 135–145)
TCO2: 26 mmol/L (ref 22–32)

## 2020-04-06 LAB — DIFFERENTIAL
Abs Immature Granulocytes: 0.01 10*3/uL (ref 0.00–0.07)
Basophils Absolute: 0 10*3/uL (ref 0.0–0.1)
Basophils Relative: 0 %
Eosinophils Absolute: 0.3 10*3/uL (ref 0.0–0.5)
Eosinophils Relative: 5 %
Immature Granulocytes: 0 %
Lymphocytes Relative: 27 %
Lymphs Abs: 1.6 10*3/uL (ref 0.7–4.0)
Monocytes Absolute: 0.5 10*3/uL (ref 0.1–1.0)
Monocytes Relative: 8 %
Neutro Abs: 3.4 10*3/uL (ref 1.7–7.7)
Neutrophils Relative %: 60 %

## 2020-04-06 LAB — COMPREHENSIVE METABOLIC PANEL
ALT: 9 U/L (ref 0–44)
AST: 29 U/L (ref 15–41)
Albumin: 3.5 g/dL (ref 3.5–5.0)
Alkaline Phosphatase: 78 U/L (ref 38–126)
Anion gap: 9 (ref 5–15)
BUN: 20 mg/dL (ref 8–23)
CO2: 25 mmol/L (ref 22–32)
Calcium: 9 mg/dL (ref 8.9–10.3)
Chloride: 106 mmol/L (ref 98–111)
Creatinine, Ser: 0.61 mg/dL (ref 0.44–1.00)
GFR, Estimated: >60 mL/min (ref >60)
Glucose, Bld: 104 mg/dL — ABNORMAL HIGH (ref 70–99)
Potassium: 3.9 mmol/L (ref 3.5–5.1)
Sodium: 140 mmol/L (ref 135–145)
Total Bilirubin: 1 mg/dL (ref 0.3–1.2)
Total Protein: 6.6 g/dL (ref 6.5–8.1)

## 2020-04-06 LAB — ECHOCARDIOGRAM COMPLETE
Area-P 1/2: 2.66 cm2
Height: 63 in
P 1/2 time: 365 msec
S' Lateral: 2.2 cm
Weight: 2024.7 oz

## 2020-04-06 LAB — GLUCOSE, CAPILLARY
Glucose-Capillary: 93 mg/dL (ref 70–99)
Glucose-Capillary: 83 mg/dL (ref 70–99)

## 2020-04-06 LAB — CBC
HCT: 41.2 % (ref 36.0–46.0)
Hemoglobin: 13.1 g/dL (ref 12.0–15.0)
MCH: 30 pg (ref 26.0–34.0)
MCHC: 31.8 g/dL (ref 30.0–36.0)
MCV: 94.5 fL (ref 80.0–100.0)
Platelets: 242 10*3/uL (ref 150–400)
RBC: 4.36 MIL/uL (ref 3.87–5.11)
RDW: 14.2 % (ref 11.5–15.5)
WBC: 5.7 10*3/uL (ref 4.0–10.5)
nRBC: 0 % (ref 0.0–0.2)

## 2020-04-06 LAB — PROTIME-INR
INR: 1 (ref 0.8–1.2)
Prothrombin Time: 13 seconds (ref 11.4–15.2)

## 2020-04-06 LAB — CBG MONITORING, ED: Glucose-Capillary: 95 mg/dL (ref 70–99)

## 2020-04-06 LAB — APTT: aPTT: 31 seconds (ref 24–36)

## 2020-04-06 IMAGING — CT CT HEAD CODE STROKE
3 series · 15 of 47 positions shown, 18 images · non-contrast
Comparison: None.

CLINICAL DATA: Code stroke.

EXAM:
CT HEAD WITHOUT CONTRAST
TECHNIQUE: Contiguous axial images were obtained from the base of the skull
through the vertex without intravenous contrast.

[Series 2: head 5.0 st · axial · 0.45mm/px · z∈[-140,+5]mm · 9 of 35 slices shown, 12 images]
[im 3/35  brain]
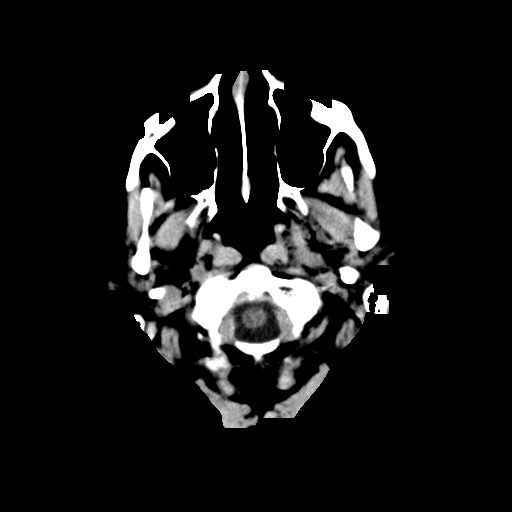
[im 3/35  bone]
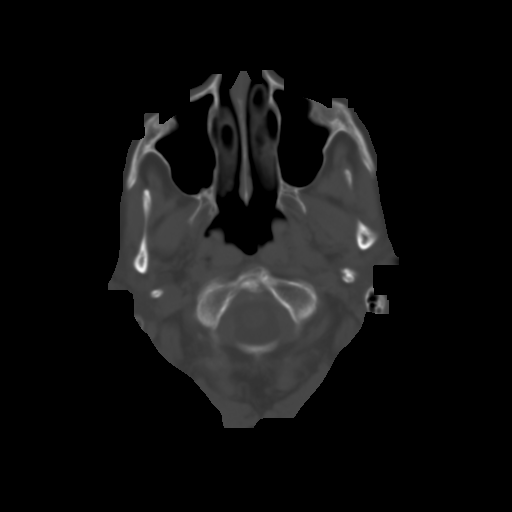
[im 6/35  brain]
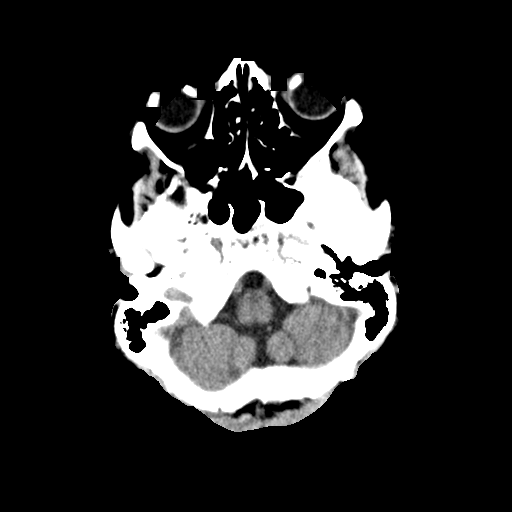
[im 10/35  brain]
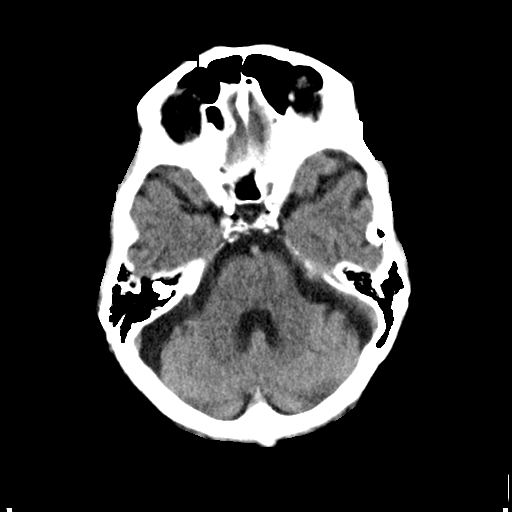
[im 13/35  brain]
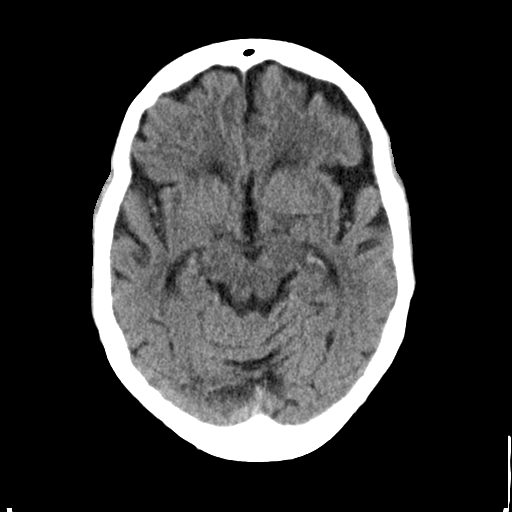
[im 18/35  brain]
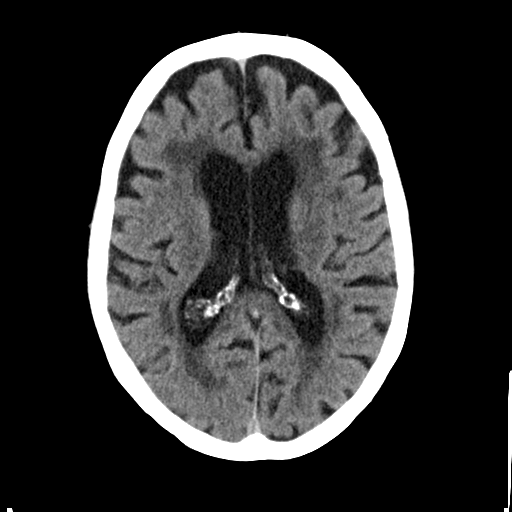
[im 18/35  bone]
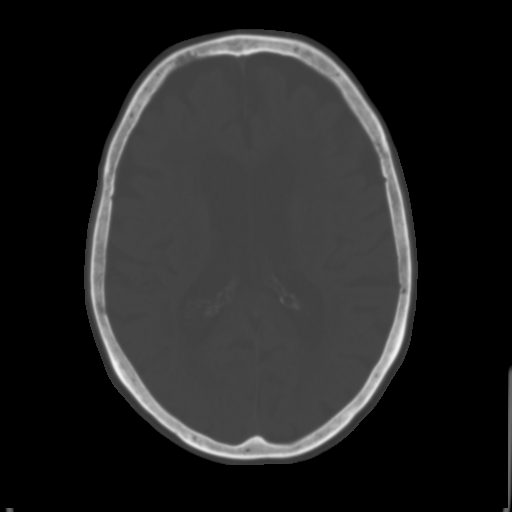
[im 22/35  brain]
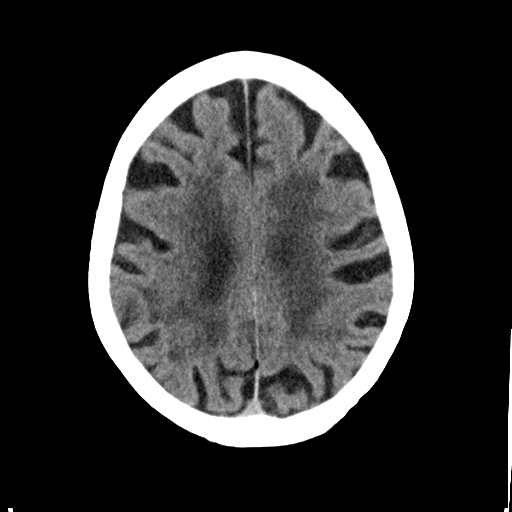
[im 25/35  brain]
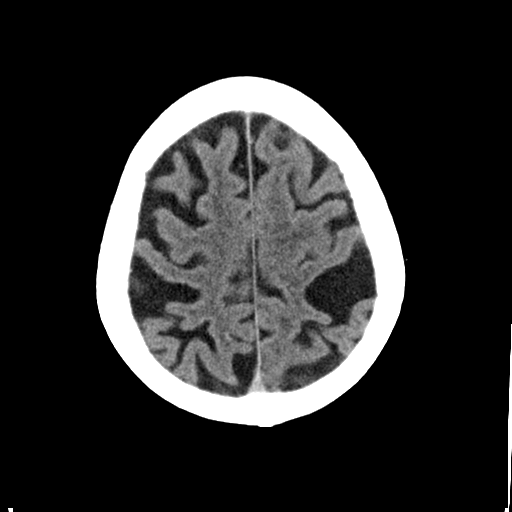
[im 29/35  brain]
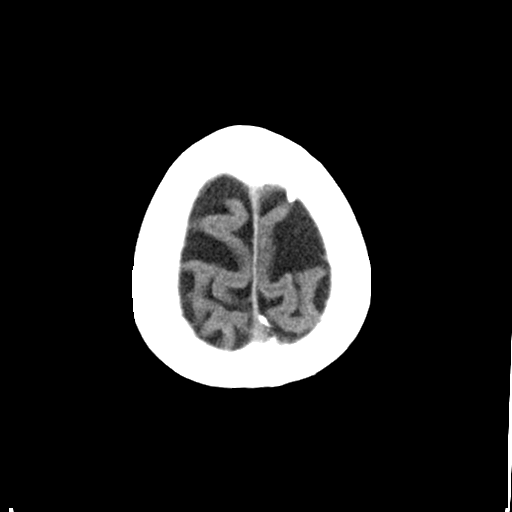
[im 32/35  brain]
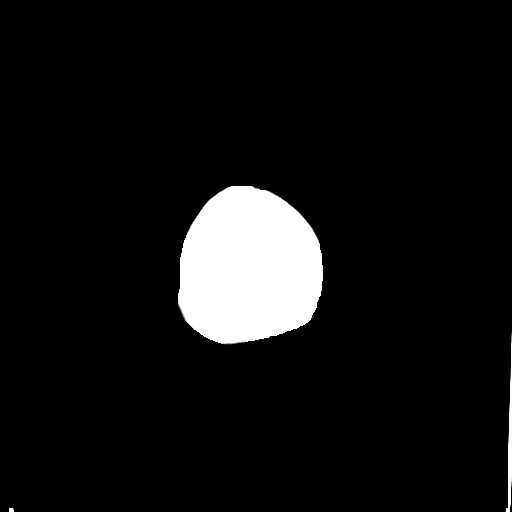
[im 32/35  bone]
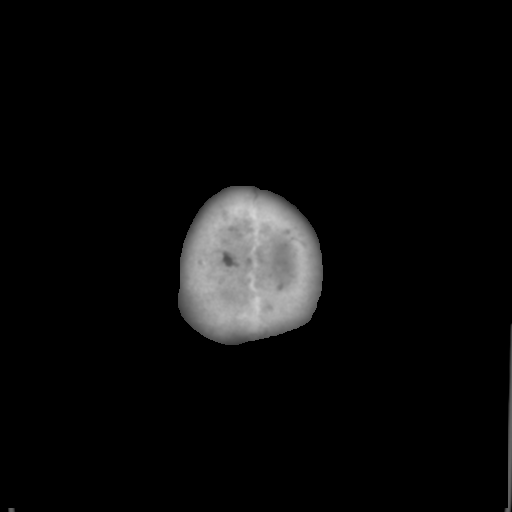

[Series 5: head 3.0 cor st · coronal · 0.33mm/px · 3 of 70 slices shown]
[im 24/70  brain]
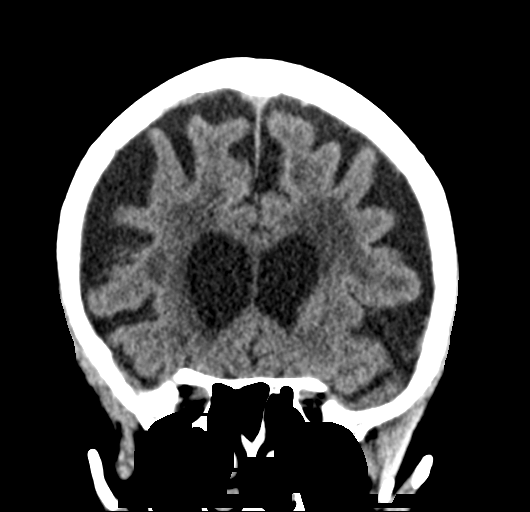
[im 31/70  brain]
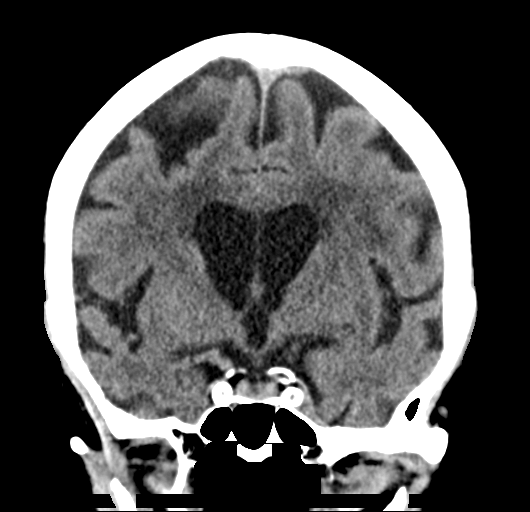
[im 39/70  brain]
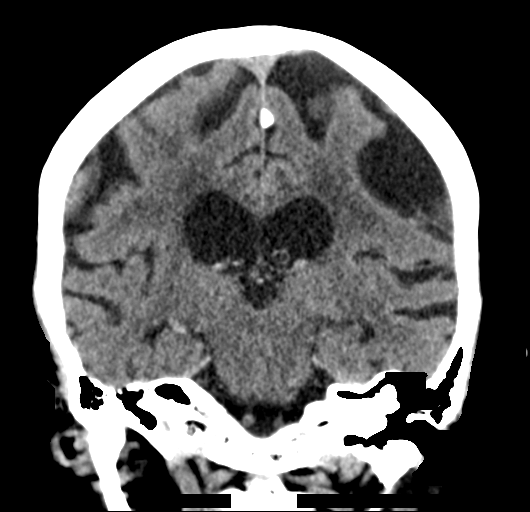

[Series 6: head 3.0 sag st · sagittal · 0.33mm/px · 3 of 67 slices shown]
[im 23/67  brain]
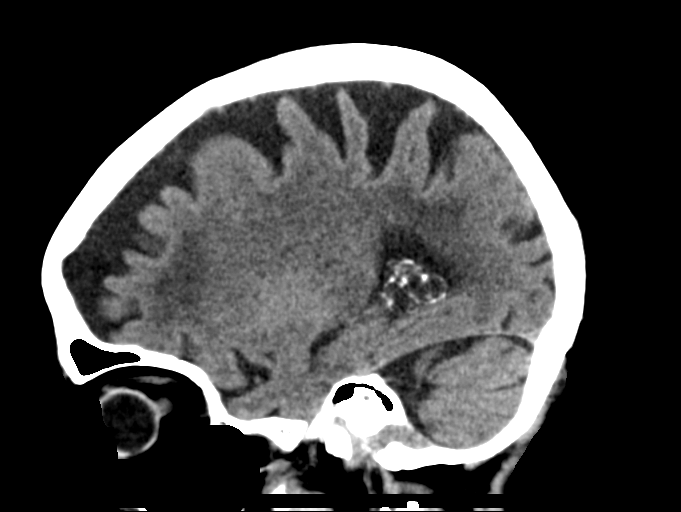
[im 34/67  brain]
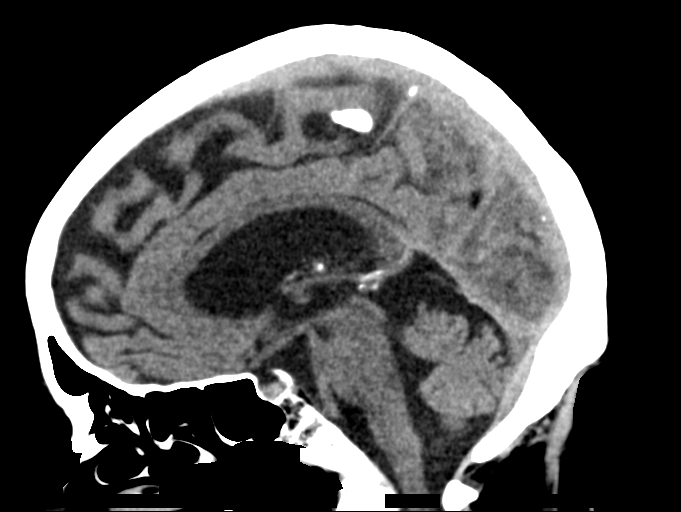
[im 45/67  brain]
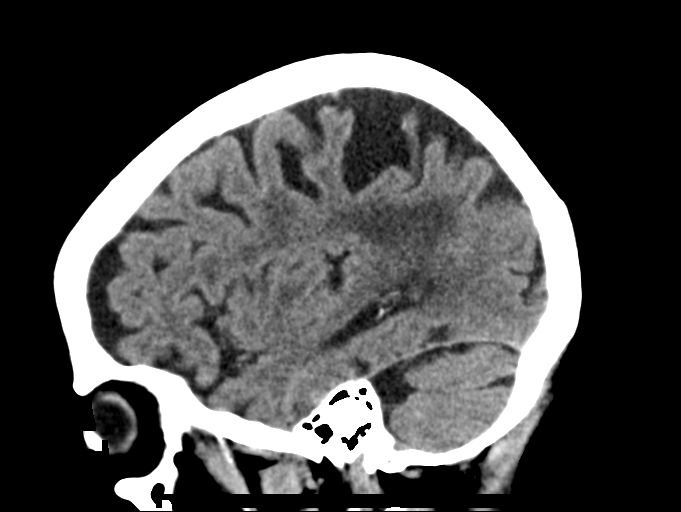

[15 of 47 positions shown; findings below may reference images not displayed]

FINDINGS: Brain: There is no acute intracranial hemorrhage. Confluent areas of
hypoattenuation in the supratentorial greater than pontine white
matter are nonspecific probably reflect moderate to advanced chronic
microvascular ischemic changes. Prominence of the ventricles and
sulci reflects generalized parenchymal volume loss. Gray-white
differentiation is preserved. There is no extra-axial fluid
collection.

Vascular: No hyperdense vessel. There is intracranial
atherosclerotic calcification at the skull base.

Skull: Unremarkable.

Sinuses/Orbits: Minor mucosal thickening. No significant orbital
abnormality.

Other: Mastoid air cells are clear.

ASPECTS (Alberta Stroke Program Early CT Score)

- Ganglionic level infarction (caudate, lentiform nuclei, internal
capsule, insula, M1-M3 cortex): 7

- Supraganglionic infarction (M4-M6 cortex): 3

Total score (0-10 with 10 being normal): 10
IMPRESSION: There is no acute intracranial hemorrhage or evidence of acute
infarction. ASPECT score is 10.

Moderate to advanced chronic microvascular ischemic changes.

These results were communicated to Dr. CLEVER at [DATE] on [DATE]
by text page via the [employer] messaging system.

## 2020-04-06 MED ORDER — LABETALOL HCL 5 MG/ML IV SOLN
10.0000 mg | Freq: Once | INTRAVENOUS | Status: AC
  Administered 2020-04-06: 10 mg via INTRAVENOUS

## 2020-04-06 MED ORDER — CHLORHEXIDINE GLUCONATE CLOTH 2 % EX PADS
6.0000 | MEDICATED_PAD | Freq: Every day | CUTANEOUS | Status: DC
  Administered 2020-04-07 – 2020-04-08 (×2): 6 via TOPICAL

## 2020-04-06 MED ORDER — SENNOSIDES-DOCUSATE SODIUM 8.6-50 MG PO TABS: 1.0000 | ORAL_TABLET | Freq: Every evening | ORAL | Status: DC | PRN

## 2020-04-06 MED ORDER — SODIUM CHLORIDE 0.9% FLUSH
3.0000 mL | Freq: Once | INTRAVENOUS | Status: DC
Start: 2020-04-06 — End: 2020-04-09

## 2020-04-06 MED ORDER — ACETAMINOPHEN 325 MG PO TABS
650.0000 mg | ORAL_TABLET | ORAL | Status: DC | PRN
  Administered 2020-04-06 – 2020-04-08 (×3): 650 mg via ORAL
  Filled 2020-04-06 (×3): qty 2

## 2020-04-06 MED ORDER — ACETAMINOPHEN 650 MG RE SUPP: 650.0000 mg | RECTAL | Status: DC | PRN

## 2020-04-06 MED ORDER — ALTEPLASE (STROKE) FULL DOSE INFUSION
0.9000 mg/kg | Freq: Once | INTRAVENOUS | Status: AC
  Administered 2020-04-06: 51.7 mg via INTRAVENOUS
  Filled 2020-04-06: qty 100

## 2020-04-06 MED ORDER — ACETAMINOPHEN 160 MG/5ML PO SOLN: 650.0000 mg | ORAL | Status: DC | PRN

## 2020-04-06 MED ORDER — STROKE: EARLY STAGES OF RECOVERY BOOK: Freq: Once | Status: DC

## 2020-04-06 MED ORDER — SODIUM CHLORIDE 0.9 % IV SOLN
50.0000 mL | Freq: Once | INTRAVENOUS | Status: AC
  Administered 2020-04-06: 50 mL via INTRAVENOUS

## 2020-04-06 MED ORDER — PANTOPRAZOLE SODIUM 40 MG IV SOLR
40.0000 mg | Freq: Every day | INTRAVENOUS | Status: DC
  Administered 2020-04-06: 40 mg via INTRAVENOUS
  Filled 2020-04-06: qty 40

## 2020-04-06 MED ORDER — CLEVIDIPINE BUTYRATE 0.5 MG/ML IV EMUL: 0.0000 mg/h | INTRAVENOUS | Status: DC

## 2020-04-06 MED ORDER — SODIUM CHLORIDE 0.9 % IV SOLN: INTRAVENOUS | Status: DC

## 2020-04-06 NOTE — ED Provider Notes (Signed)
[facility] EMERGENCY DEPARTMENT Provider Note   CSN: [id number] Arrival date & time: 04/06/20  1147     History No chief complaint on file.   [name] is a 87 y.o. female.  She is a stroke activation.  Last known well was 9 AM.  Acute onset of left-sided weakness and unsteady gait.  The history is provided by the patient and the EMS personnel.  Cerebrovascular Accident This is a new problem. The current episode started 3 to 5 hours ago. The problem occurs constantly. The problem has not changed since onset.Pertinent negatives include no chest pain, no abdominal pain, no headaches and no shortness of breath. The symptoms are aggravated by walking. Nothing relieves the symptoms. She has tried nothing for the symptoms. The treatment provided no relief.       Past Medical History:  Diagnosis Date  . Arthritis   . Hypertension     There are no problems to display for this patient.   Past Surgical History:  Procedure Laterality Date  . ABDOMINAL HYSTERECTOMY    . JOINT REPLACEMENT Right 1995  . JOINT REPLACEMENT Left 1997     OB History   No obstetric history on file.     No family history on file.  Social History   Tobacco Use  . Smoking status: Former Smoker    Packs/day: 0.50    Years: 18.00    Pack years: 9.00    Quit date: 1975    Years since quitting: 47.2  . Smokeless tobacco: Never Used  Substance Use Topics  . Alcohol use: Yes  . Drug use: Never    Home Medications Prior to Admission medications   Medication Sig Start Date End Date Taking? Authorizing Provider  amLODipine (NORVASC) 5 MG tablet Take 5 mg by mouth at bedtime.  09/05/19   [provider]  aspirin EC 81 MG tablet Take 81 mg by mouth daily. Swallow whole.    [provider]  cholecalciferol (VITAMIN D3) 25 MCG (1000 UNIT) tablet Take 1,000 Units by mouth daily.    [provider]  levothyroxine (SYNTHROID) 50 MCG tablet Take 50 mcg by  mouth daily. 08/01/19   [provider]  meclizine (ANTIVERT) 12.5 MG tablet Take 1 tablet (12.5 mg total) by mouth 3 (three) times daily as needed for dizziness. 10/10/19   Lorre Nick, MD  Multiple Vitamins-Minerals (ICAPS AREDS 2 PO) Take 1 capsule by mouth daily.    [provider]    Allergies    Sulfa antibiotics  Review of Systems   Review of Systems  Constitutional: Negative for fever.  HENT: Negative for sore throat.   Eyes: Negative for visual disturbance.  Respiratory: Negative for shortness of breath.   Cardiovascular: Negative for chest pain.  Gastrointestinal: Negative for abdominal pain.  Genitourinary: Negative for dysuria.  Musculoskeletal: Positive for gait problem.  Skin: Negative for rash.  Neurological: Positive for weakness. Negative for speech difficulty and headaches.    Physical Exam Updated Vital Signs There were no vitals taken for this visit.  Physical Exam Vitals and nursing note reviewed.  Constitutional:      General: She is not in acute distress.    Appearance: She is well-developed.  HENT:     Head: Normocephalic and atraumatic.  Eyes:     Conjunctiva/sclera: Conjunctivae normal.  Cardiovascular:     Rate and Rhythm: Normal rate and regular rhythm.     Heart sounds: No murmur heard.  Pulmonary:     Effort: Pulmonary effort is normal. No respiratory distress.     Breath sounds: Normal breath sounds.  Abdominal:     Palpations: Abdomen is soft.     Tenderness: There is no abdominal tenderness.  Musculoskeletal:     Cervical back: Neck supple.  Skin:    General: Skin is warm and dry.  Neurological:     Mental Status: She is alert and oriented to person, place, and time.     Cranial Nerves: No cranial nerve deficit.     Motor: Weakness present.     Comments: She has 4 out of 5 strength left arm and left leg.  Right side preserved.     ED Results / Procedures / Treatments   Labs (all labs ordered are listed,  but only abnormal results are displayed) Labs Reviewed  COMPREHENSIVE METABOLIC PANEL - Abnormal; Notable for the following components:      Result Value   Glucose, Bld 104 (*)    All other components within normal limits  I-STAT CHEM 8, ED - Abnormal; Notable for the following components:   BUN 28 (*)    Glucose, Bld 102 (*)    Calcium, Ion 0.99 (*)    All other components within normal limits  PROTIME-INR  APTT  CBC  DIFFERENTIAL  HEMOGLOBIN A1C  LIPID PANEL  CBG MONITORING, ED    EKG None  Radiology ECHOCARDIOGRAM COMPLETE  Result Date: 04/06/2020    ECHOCARDIOGRAM REPORT   Patient Name:   Shelley Sloan Date of Exam: 04/06/2020 Medical Rec #:  [id number]        Height:       63.0 in Accession #:    [id number]       Weight:       126.5 lb Date of Birth:  [birth year]        BSA:          1.592 m Patient Age:    89 years         BP:           156/54 mmHg Patient Gender: F                HR:           63 bpm. Exam Location:  Inpatient Procedure: 2D Echo Indications:    Stroke I63.9  History:        Patient has no prior history of Echocardiogram examinations.                 Risk Factors:Hypertension.  Sonographer:    Thurman Coyer RDCS (AE) Referring Phys: [id number] ASHISH ARORA IMPRESSIONS  1. Left ventricular ejection fraction, by estimation, is 60 to 65%. The left ventricle has normal function. The left ventricle has no regional wall motion abnormalities. There is mild asymmetric left ventricular hypertrophy of the basal and septal segments. Left ventricular diastolic parameters were normal.  2. Right ventricular systolic function is normal. The right ventricular size is normal. There is normal pulmonary artery systolic pressure.  3. The mitral valve is normal in structure. No evidence of mitral valve regurgitation. No evidence of mitral stenosis.  4. The aortic valve is tricuspid. There is mild calcification of the aortic valve. Aortic valve regurgitation is not visualized. Mild aortic  valve sclerosis is present, with no evidence of aortic valve stenosis.  5. The inferior vena cava is normal in size with greater than 50% respiratory variability, suggesting right atrial pressure of 3  mmHg. FINDINGS  Left Ventricle: Left ventricular ejection fraction, by estimation, is 60 to 65%. The left ventricle has normal function. The left ventricle has no regional wall motion abnormalities. The left ventricular internal cavity size was normal in size. There is  mild asymmetric left ventricular hypertrophy of the basal and septal segments. Left ventricular diastolic parameters were normal. Right Ventricle: The right ventricular size is normal. No increase in right ventricular wall thickness. Right ventricular systolic function is normal. There is normal pulmonary artery systolic pressure. The tricuspid regurgitant velocity is 2.16 m/s, and  with an assumed right atrial pressure of 3 mmHg, the estimated right ventricular systolic pressure is 21.7 mmHg. Left Atrium: Left atrial size was normal in size. Right Atrium: Right atrial size was normal in size. Pericardium: There is no evidence of pericardial effusion. Mitral Valve: The mitral valve is normal in structure. No evidence of mitral valve regurgitation. No evidence of mitral valve stenosis. Tricuspid Valve: The tricuspid valve is normal in structure. Tricuspid valve regurgitation is mild . No evidence of tricuspid stenosis. Aortic Valve: The aortic valve is tricuspid. There is mild calcification of the aortic valve. Aortic valve regurgitation is not visualized. Aortic regurgitation PHT measures 365 msec. Mild aortic valve sclerosis is present, with no evidence of aortic valve stenosis. Pulmonic Valve: The pulmonic valve was normal in structure. Pulmonic valve regurgitation is not visualized. No evidence of pulmonic stenosis. Aorta: The aortic root is normal in size and structure. Venous: The inferior vena cava is normal in size with greater than 50%  respiratory variability, suggesting right atrial pressure of 3 mmHg. IAS/Shunts: No atrial level shunt detected by color flow Doppler.  LEFT VENTRICLE PLAX 2D LVIDd:         3.60 cm  Diastology LVIDs:         2.20 cm  LV e' medial:    7.51 cm/s LV PW:         1.10 cm  LV E/e' medial:  12.1 LV IVS:        1.20 cm  LV e' lateral:   7.62 cm/s LVOT diam:     2.10 cm  LV E/e' lateral: 11.9 LV SV:         80 LV SV Index:   50 LVOT Area:     3.46 cm  RIGHT VENTRICLE RV S prime:     12.40 cm/s TAPSE (M-mode): 2.2 cm LEFT ATRIUM             Index       RIGHT ATRIUM           Index LA diam:        3.20 cm 2.01 cm/m  RA Area:     15.20 cm LA Vol (A2C):   27.5 ml 17.28 ml/m RA Volume:   36.90 ml  23.18 ml/m LA Vol (A4C):   32.4 ml 20.35 ml/m LA Biplane Vol: 30.5 ml 19.16 ml/m  AORTIC VALVE LVOT Vmax:   85.80 cm/s LVOT Vmean:  58.200 cm/s LVOT VTI:    0.232 m AI PHT:      365 msec  AORTA Ao Root diam: 3.20 cm MITRAL VALVE                TRICUSPID VALVE MV Area (PHT): 2.66 cm     TR Peak grad:   18.7 mmHg MV Decel Time: 285 msec     TR Vmax:        216.00 cm/s MV E velocity: 90.80  cm/s MV A velocity: 113.00 cm/s  SHUNTS MV E/A ratio:  0.80         Systemic VTI:  0.23 m                             Systemic Diam: 2.10 cm Charlton Haws MD Electronically signed by Charlton Haws MD Signature Date/Time: 04/06/2020/2:05:07 PM    Final    CT HEAD CODE STROKE WO CONTRAST  Result Date: 04/06/2020 CLINICAL DATA:  Code stroke. EXAM: CT HEAD WITHOUT CONTRAST TECHNIQUE: Contiguous axial images were obtained from the base of the skull through the vertex without intravenous contrast. COMPARISON:  None. FINDINGS: Brain: There is no acute intracranial hemorrhage. Confluent areas of hypoattenuation in the supratentorial greater than pontine white matter are nonspecific probably reflect moderate to advanced chronic microvascular ischemic changes. Prominence of the ventricles and sulci reflects generalized parenchymal volume loss. Gray-white  differentiation is preserved. There is no extra-axial fluid collection. Vascular: No hyperdense vessel. There is intracranial atherosclerotic calcification at the skull base. Skull: Unremarkable. Sinuses/Orbits: Minor mucosal thickening. No significant orbital abnormality. Other: Mastoid air cells are clear. ASPECTS (Alberta Stroke Program Early CT Score) - Ganglionic level infarction (caudate, lentiform nuclei, internal capsule, insula, M1-M3 cortex): 7 - Supraganglionic infarction (M4-M6 cortex): 3 Total score (0-10 with 10 being normal): 10 IMPRESSION: There is no acute intracranial hemorrhage or evidence of acute infarction. ASPECT score is 10. Moderate to advanced chronic microvascular ischemic changes. These results were communicated to Dr. Wilford Corner at 12:04 pm on 04/06/2020 by text page via the [facility] messaging system. Electronically Signed   By: Guadlupe Spanish M.D.   On: 04/06/2020 12:09    Procedures .Critical Care Performed by: Terrilee Files, MD Authorized by: Terrilee Files, MD   Critical care provider statement:    Critical care time (minutes):  45   Critical care time was exclusive of:  Separately billable procedures and treating other patients   Critical care was necessary to treat or prevent imminent or life-threatening deterioration of the following conditions:  CNS failure or compromise   Critical care was time spent personally by me on the following activities:  Discussions with consultants, evaluation of patient's response to treatment, examination of patient, ordering and performing treatments and interventions, ordering and review of laboratory studies, ordering and review of radiographic studies, pulse oximetry, re-evaluation of patient's condition, obtaining history from patient or surrogate, review of old charts and development of treatment plan with patient or surrogate     Medications Ordered in ED Medications  sodium chloride flush (NS) 0.9 % injection 3 mL (3 mLs  Intravenous Not Given 04/06/20 1430)   stroke: mapping our early stages of recovery book (has no administration in time range)  0.9 %  sodium chloride infusion ( Intravenous New Bag/Given 04/06/20 1433)  acetaminophen (TYLENOL) tablet 650 mg (has no administration in time range)    Or  acetaminophen (TYLENOL) 160 MG/5ML solution 650 mg (has no administration in time range)    Or  acetaminophen (TYLENOL) suppository 650 mg (has no administration in time range)  pantoprazole (PROTONIX) injection 40 mg (has no administration in time range)  senna-docusate (Senokot-S) tablet 1 tablet (has no administration in time range)  clevidipine (CLEVIPREX) infusion 0.5 mg/mL (has no administration in time range)  alteplase (ACTIVASE) 1 mg/mL infusion 51.7 mg (0 mg Intravenous Stopped 04/06/20 1305)    Followed by  0.9 %  sodium chloride infusion (0 mLs  Intravenous Stopped 04/06/20 1428)  labetalol (NORMODYNE) injection 10 mg (10 mg Intravenous Given 04/06/20 1205)    ED Course  I have reviewed the triage vital signs and the nursing notes.  Pertinent labs & imaging results that were available during my care of the patient were reviewed by me and considered in my medical decision making (see chart for details).  Clinical Course as of 04/06/20 1714  Mon Apr 06, 2020  1207 Dr. Wilford CornerArora is proceeding with TPA. [MB]  1221 EKG showing normal sinus rhythm rate of 66 normal intervals no acute ST-T changes. [MB]    Clinical Course User Index [MB] Terrilee FilesButler, Nevaen Tredway C, MD   MDM Rules/Calculators/A&P                         This patient complains of acute left-sided weakness unsteady gait; this involves an extensive number of treatment Options and is a complaint that carries with it a high risk of complications and Morbidity. The differential includes stroke, bleed, dissection, hypoglycemia, arrhythmia  I ordered, reviewed and interpreted labs, which included CBC with normal white count normal hemoglobin, chemistries  fairly normal other than mildly elevated glucose, INR normal I ordered imaging studies which included CT head and I independently    visualized and interpreted imaging which showed no acute findings Additional history obtained from EMS Previous records obtained and reviewed in epic, no recent admissions I consulted [facility] hospitalist Dr .Wilford CornerArora  And discussed lab and imaging findings  Critical Interventions: Emergent evaluation of patient with imaging, initiation of TPA  After the interventions stated above, I reevaluated the patient and found patient's weakness to be improving.  She will need to be admitted to the hospital for continued management.   Final Clinical Impression(s) / ED Diagnoses Final diagnoses:  Acute CVA (cerebrovascular accident) [facility](HCC)    Rx / DC Orders ED Discharge Orders    None       Terrilee FilesButler, Siarra Gilkerson C, MD 04/06/20 1717

## 2020-04-06 NOTE — H&P (Signed)
Neurology Admission H&P   CC: Left-sided weakness  History is obtained from: Patient, chart  HPI: Shelley Sloan is a 87 y.o. female past medical history of hypertension, arthritis, living at an assisted living facility, last known well at 9 AM when she woke up and went to the bathroom without any problems, went back to take a nap and at around 10 AM noticed that her left side was weaker. She noticed weakness in the arm and leg on both sides.  The weakness did not subside and she also felt some subjective sense of heaviness and numbness on that side.  EMS was called.  On their evaluation, she had some left-sided arm and leg drift.  Code stroke was activated and she was brought in for emergent stroke evaluation. On the bridge, her NIH stroke scale was 3. Noncontrast head CT without evidence of bleed. Minor delay in TPA administration due to requirement for labetalol for getting blood pressure under goal. Risk benefits of TPA discussed with the patient   LKW: 9 AM to 04/06/2020 tpa given?:  Yes-at 12:07 PM on 04/06/2020 Premorbid modified Rankin scale (mRS): 1  ROS: Performed and negative except as noted in HPI  Past Medical History:  Diagnosis Date  . Arthritis   . Hypertension     No family history on file.   Social History:   reports that she quit smoking about 47 years ago. She has a 9.00 pack-year smoking history. She has never used smokeless tobacco. She reports current alcohol use. She reports that she does not use drugs.  Medications  Current Facility-Administered Medications:  .   stroke: mapping our early stages of recovery book, , Does not apply, Once, Milon Dikes, MD .  0.9 %  sodium chloride infusion, , Intravenous, Continuous, Milon Dikes, MD .  acetaminophen (TYLENOL) tablet 650 mg, 650 mg, Oral, Q4H PRN **OR** acetaminophen (TYLENOL) 160 MG/5ML solution 650 mg, 650 mg, Per Tube, Q4H PRN **OR** acetaminophen (TYLENOL) suppository 650 mg, 650 mg, Rectal, Q4H PRN,  Milon Dikes, MD .  clevidipine (CLEVIPREX) infusion 0.5 mg/mL, 0-21 mg/hr, Intravenous, Continuous, Milon Dikes, MD .  pantoprazole (PROTONIX) injection 40 mg, 40 mg, Intravenous, QHS, Milon Dikes, MD .  senna-docusate (Senokot-S) tablet 1 tablet, 1 tablet, Oral, QHS PRN, Milon Dikes, MD .  sodium chloride flush (NS) 0.9 % injection 3 mL, 3 mL, Intravenous, Once, Terrilee Files, MD  Current Outpatient Medications:  .  amLODipine (NORVASC) 5 MG tablet, Take 5 mg by mouth at bedtime. , Disp: , Rfl:  .  aspirin EC 81 MG tablet, Take 81 mg by mouth daily. Swallow whole., Disp: , Rfl:  .  cholecalciferol (VITAMIN D3) 25 MCG (1000 UNIT) tablet, Take 1,000 Units by mouth daily., Disp: , Rfl:  .  levothyroxine (SYNTHROID) 50 MCG tablet, Take 50 mcg by mouth daily., Disp: , Rfl:  .  meclizine (ANTIVERT) 12.5 MG tablet, Take 1 tablet (12.5 mg total) by mouth 3 (three) times daily as needed for dizziness., Disp: 30 tablet, Rfl: 0 .  Multiple Vitamins-Minerals (ICAPS AREDS 2 PO), Take 1 capsule by mouth daily., Disp: , Rfl:    Exam: Initial blood pressure 185/100 Current vital signs: BP (!) 145/20 (BP Location: Right Arm)   Pulse 63   Temp 97.7 F (36.5 C) (Oral)   Resp 14   Ht 5\' 3"  (1.6 m)   Wt 57.4 kg   SpO2 99%   BMI 22.42 kg/m  Vital signs in last 24 hours: Temp:  [  97.7 F (36.5 C)] 97.7 F (36.5 C) (04/04 1216) Pulse Rate:  [63] 63 (04/04 1216) Resp:  [14] 14 (04/04 1216) BP: (145)/(20) 145/20 (04/04 1216) SpO2:  [99 %] 99 % (04/04 1216) Weight:  [57.4 kg] 57.4 kg (04/04 1216)  GENERAL: Awake, alert in NAD HEENT: - Normocephalic and atraumatic, dry mm, no LN++, no Thyromegally LUNGS - Clear to auscultation bilaterally with no wheezes CV - S1S2 RRR, no m/r/g, equal pulses bilaterally. ABDOMEN - Soft, nontender, nondistended with normoactive BS Ext: warm, well perfused, intact peripheral pulses, no edema  NEURO:  Mental Status: AA&Ox3 Language: speech is clear and not  dysarthric.  Naming, repetition, fluency, and comprehension intact. Cranial Nerves: PERRLEOMI, visual fields full, no facial asymmetry, facial sensation intact, hearing reduced bilaterally, tongue/uvula/soft palate midline, normal sternocleidomastoid and trapezius muscle strength. No evidence of tongue atrophy or fibrillations Motor: Left upper and lower extremity have mild vertical drift with 4/5 strength.  Right upper and lower extremity 5/5 strength. Tone: is normal and bulk is normal Sensation-mild diminished sensation on the left leg comparison to the right.  Subjective left upper extremity numbness as well.  Facial sensation preserved bilaterally Coordination: FTN intact bilaterally, no ataxia in BLE. Gait- deferred  NIHSS-3   Labs I have reviewed labs in epic and the results pertinent to this consultation are:   CBC    Component Value Date/Time   WBC 5.7 04/06/2020 1150   RBC 4.36 04/06/2020 1150   HGB 12.9 04/06/2020 1159   HCT 38.0 04/06/2020 1159   PLT 242 04/06/2020 1150   MCV 94.5 04/06/2020 1150   MCH 30.0 04/06/2020 1150   MCHC 31.8 04/06/2020 1150   RDW 14.2 04/06/2020 1150   LYMPHSABS 1.6 04/06/2020 1150   MONOABS 0.5 04/06/2020 1150   EOSABS 0.3 04/06/2020 1150   BASOSABS 0.0 04/06/2020 1150    CMP     Component Value Date/Time   NA 140 04/06/2020 1159   K 4.0 04/06/2020 1159   CL 107 04/06/2020 1159   CO2 26 10/10/2019 0843   GLUCOSE 102 (H) 04/06/2020 1159   BUN 28 (H) 04/06/2020 1159   CREATININE 0.60 04/06/2020 1159   CALCIUM 8.8 (L) 10/10/2019 0843   PROT 7.0 10/10/2019 0843   ALBUMIN 3.9 10/10/2019 0843   AST 23 10/10/2019 0843   ALT 11 10/10/2019 0843   ALKPHOS 84 10/10/2019 0843   BILITOT 0.8 10/10/2019 0843   GFRNONAA >60 10/10/2019 0843   Imaging I have reviewed the images obtained:  CT-scan of the brain-aspects 10.  Negative for bleed.  No acute stroke changes.  Chronic white matter disease  Assessment:  87 year old with above past  medical history presenting for sudden onset of left-sided hemiparesis with arm and leg involvement. Likely secondary to small vessel etiology stroke.  Other differentials include an embolic event.  Clinical exam not consistent with a large vessel occlusion hence vessel imaging not performed emergently. Within the window for IV TPA-recent benefits discussed with the patient and IV TPA bolus administered at 12:07 PM on 04/06/2020.  Of note, she required 1 dose of labetalol 10 mg IV because her systolic blood pressure was greater than 185.  Plan:  Acute Ischemic Stroke-etiology work-up pending  Acuity: Acute -Admit to: Neurological ICU -Hold Aspirin until 24 hour post tPA neuroimaging is stable and without evidence of bleeding -Blood pressure control, goal of SYS <180 -MRI/ECHO/A1C/Lipid panel. -Hyperglycemia management per SSI to maintain glucose 140-180mg /dL. -PT/OT/ST therapies and recommendations when able  CNS Close neuro  monitoring Speech therapy for possible dysphagia N.p.o. until cleared by bedside swallow or formal speech evaluation  Hemiplegia and hemiparesis following cerebral infarction affecting left non-dominant side  -PT/OT  RESP No active issues Monitor clinically Obtain chest x-ray to rule out pneumonia  CV Essential hypertension Presented in hypertensive emergency-with systolic greater than 185. Required 1 dose of labetalol Use Cleviprex drip as needed to keep blood pressure below 180 per the post TPA protocol. Check TTE  Hyperlipidemia, unspecified  - Statin for goal LDL < 70  HEME No active issues Check CBC in the morning  ENDO Mild hyperglycemia with sugar 102. Continue to monitor BMPs   GI/GU BUN 28 Normal creatinine Gentle hydration with normal saline at 75 cc an hour   Fluid/Electrolyte Disorders Check electrolytes in the morning and replete as necessary   ID No active issues  Prophylaxis DVT: SCDs GI: PPI Bowel: Docusate  senna  Diet: NPO until cleared by bedside stroke swallow screen or formal speech evaluation  Code Status: Full Code   Plan discussed with the ED provider-Dr. Charm Barges   THE FOLLOWING WERE PRESENT ON ADMISSION: Acute ischemic stroke, hemiparesis, hypertensive emergency  -- Milon Dikes, MD Neurologist [facility] Pager: [phone]  CRITICAL CARE ATTESTATION Performed by: Milon Dikes, MD Total critical care time: 35 minutes Critical care time was exclusive of separately billable procedures and treating other patients and/or supervising APPs/Residents/Students Critical care was necessary to treat or prevent imminent or life-threatening deterioration due to acute ischemic stroke, IV thrombolysis. This patient is critically ill and at significant risk for neurological worsening and/or death and care requires constant monitoring. Critical care was time spent personally by me on the following activities: development of treatment plan with patient and/or surrogate as well as nursing, discussions with consultants, evaluation of patient's response to treatment, examination of patient, obtaining history from patient or surrogate, ordering and performing treatments and interventions, ordering and review of laboratory studies, ordering and review of radiographic studies, pulse oximetry, re-evaluation of patient's condition, participation in multidisciplinary rounds and medical decision making of high complexity in the care of this patient.

## 2020-04-06 NOTE — Progress Notes (Signed)
NIHSS 1a Level of Conscious.: 0 1b LOC Questions: 0 1c LOC Commands: 0 2 Best Gaze: 0 3 Visual: 0 4 Facial Palsy: 0 5a Motor Arm - left: 0 5b Motor Arm - Right: 0 6a Motor Leg - Left: 0 6b Motor Leg - Right: 0 7 Limb Ataxia: 0 8 Sensory: 1 9 Best Language: 0 10 Dysarthria: 0 11 Extinct. and Inatten.: 0 TOTAL: 1   Can change to OPTIMIST main monitoring at 1407  -- Milon Dikes, MD Neurologist [facility] Pager: [phone]

## 2020-04-06 NOTE — Progress Notes (Signed)
PT Cancellation Note  Patient Details Name: Shelley Sloan MRN: [id number] DOB: [birth year]   Cancelled Treatment:    Reason Eval/Treat Not Completed: Medical issues which prohibited therapy;Active bedrest order Pt with strict bed rest orders. Will follow up when pt medically appropriate.   Cindee Salt, DPT  Acute Rehabilitation Services  Pager: [phone] Office: [phone]    Lehman Prom 04/06/2020, 12:27 PM

## 2020-04-06 NOTE — Code Documentation (Signed)
Pt is 87 yr old female brought in by EMS for left sided weakness. She was last known well this morning at 0900. Pt arrived at 1148. Labs drawn, glucose checked, cleared by EDP. Pt taken to CT at 1150. Pt is alert and fluent, with left sided numbness and perceived weakness. See flowsheet for times and full NIHSS. CT non-contrast was neg for acute hemorrhage per Neurologist. Treatment decision made by neurologist to give TPA. At 1159. SBP>185 so 10 mg labetelol given at 1205. Subsequent BP 145/82, and TPA started at 1207. Pt returned to room 30. She was monitored q 15 min for one hour, at which time post tpa flush was given. Pt still having some intermittent numbness on left side. She has a very small amt of bleeding from bottom lip. Pt will need q 15 min VS and mNIHSS until 1415, at which time she will progress to q 30 min for 6 hr then q hour for 16 hr, unless pt deemed appropriate for [facility] lower intensity monitoring by Neurologist. Pt not candidate for NIR as exam is LVO negative. Plan of care discussed with Belenda Cruise RN at pt's bedside.

## 2020-04-06 NOTE — Progress Notes (Signed)
  Echocardiogram 2D Echocardiogram has been performed.  Tye Savoy 04/06/2020, 1:50 PM

## 2020-04-06 NOTE — ED Notes (Signed)
Called floor to give report, waiting on nurse to call back.

## 2020-04-06 NOTE — ED Triage Notes (Signed)
Patient arrives via EMS from [facility] for stroke.  EMS reports left sided weakness in left arm and leg.  BCG is 95.

## 2020-04-06 NOTE — Progress Notes (Signed)
PHARMACIST CODE STROKE RESPONSE  Notified to mix tPA at 1159 by Dr. Wilford Corner Delivered tPA to RN at 1203  tPA dose = 5.2mg  bolus over 1 minute followed by 46.5mg  for a total dose of 51.7mg  over 1 hour  Issues/delays encountered (if applicable): No blood pressure checked prior so waited on BP results and found to have elevated blood pressure - labetalol x1 given with improvement in BP and alteplase infusion started at 1207  Gerrit Halls, PharmD Clinical Pharmacist  04/06/20 12:17 PM

## 2020-04-07 ENCOUNTER — Inpatient Hospital Stay (HOSPITAL_COMMUNITY): Payer: Medicare Other

## 2020-04-07 DIAGNOSIS — I10 Essential (primary) hypertension: Secondary | ICD-10-CM

## 2020-04-07 DIAGNOSIS — E7800 Pure hypercholesterolemia, unspecified: Secondary | ICD-10-CM

## 2020-04-07 DIAGNOSIS — I639 Cerebral infarction, unspecified: Secondary | ICD-10-CM

## 2020-04-07 DIAGNOSIS — Z9282 Status post administration of tPA (rtPA) in a different facility within the last 24 hours prior to admission to current facility: Secondary | ICD-10-CM

## 2020-04-07 LAB — LIPID PANEL
Cholesterol: 170 mg/dL (ref 0–200)
HDL: 58 mg/dL (ref >40)
LDL Cholesterol: 96 mg/dL (ref 0–99)
Total CHOL/HDL Ratio: 2.9 RATIO
Triglycerides: 80 mg/dL (ref <150)
VLDL: 16 mg/dL (ref 0–40)

## 2020-04-07 LAB — MRSA PCR SCREENING: MRSA by PCR: NEGATIVE

## 2020-04-07 LAB — GLUCOSE, CAPILLARY
Glucose-Capillary: 95 mg/dL (ref 70–99)
Glucose-Capillary: 81 mg/dL (ref 70–99)

## 2020-04-07 LAB — HEMOGLOBIN A1C
Hgb A1c MFr Bld: 5.8 % — ABNORMAL HIGH (ref 4.8–5.6)
Mean Plasma Glucose: 119.76 mg/dL

## 2020-04-07 IMAGING — MR MR MRA HEAD W/O CM
9 of 11 series · 32 of 48 positions shown · non-contrast
Comparison: Head CT yesterday.

CLINICAL DATA: Acute onset of left-sided weakness.

EXAM:
MRI HEAD WITHOUT CONTRAST
MRA HEAD WITHOUT CONTRAST
TECHNIQUE: Multiplanar, multiecho pulse sequences of the brain and surrounding
structures were obtained without intravenous contrast. Angiographic
images of the head were obtained using MRA technique without
contrast.

[Series 3: DWI · axial · 3.0mm · 1.09mm/px · z∈[-30,+100]mm · 7 of 90 slices shown (1 of 4)]
[im 1/90]
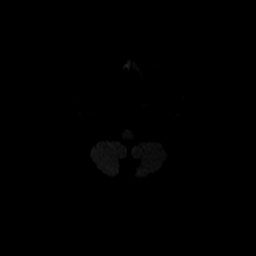
[im 15/90]
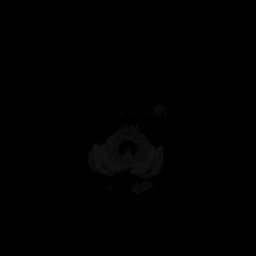
[im 30/90]
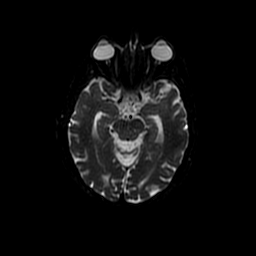
[im 45/90]
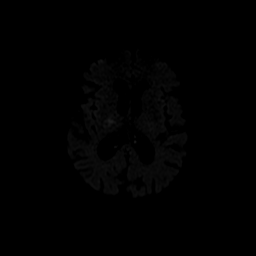
[im 60/90]
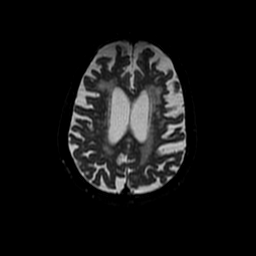
[im 75/90]
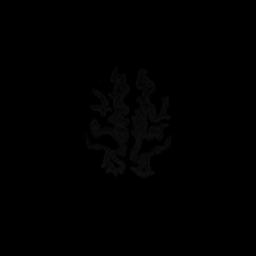
[im 90/90]
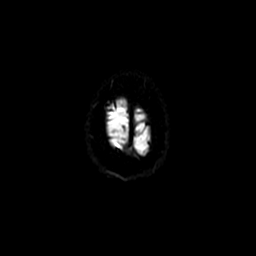

[Series 5: DWI · coronal · 5.0mm · 1.09mm/px · 5 of 66 slices shown (2 of 4)]
[im 1/66]
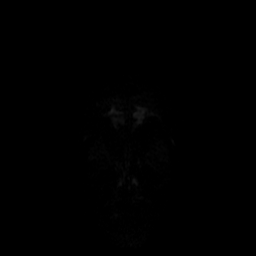
[im 17/66]
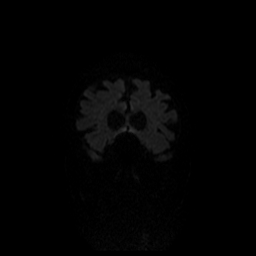
[im 33/66]
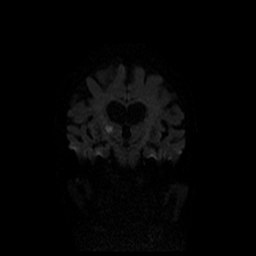
[im 49/66]
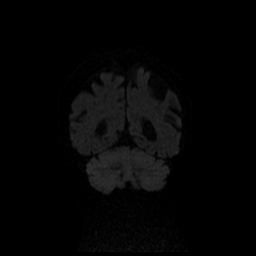
[im 66/66]
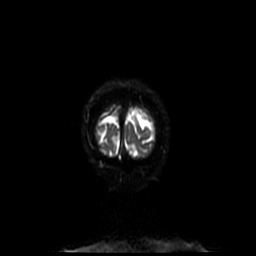

[Series 6: T1 · sagittal · 5.0mm · 0.47mm/px · 2 of 23 slices shown (1 of 2)]
[im 1/23]
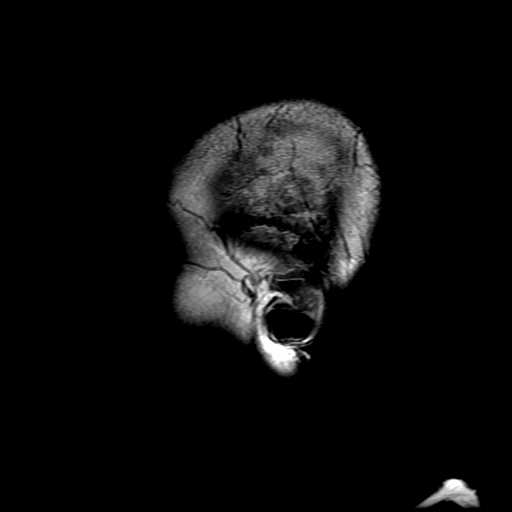
[im 23/23]
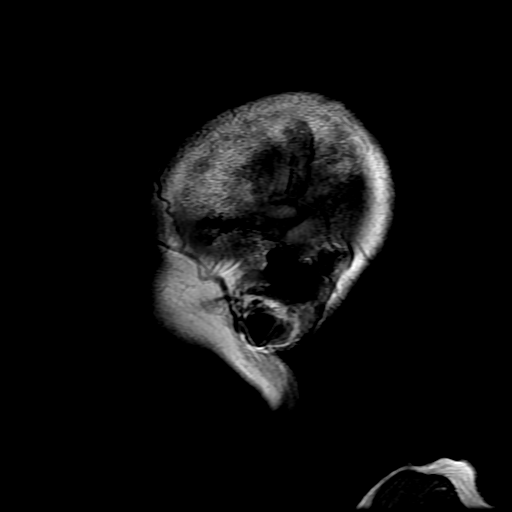

[Series 7: T2 · axial · 5.0mm · 0.43mm/px · z∈[-29,+113]mm · 2 of 25 slices shown (1 of 2)]
[im 1/25]
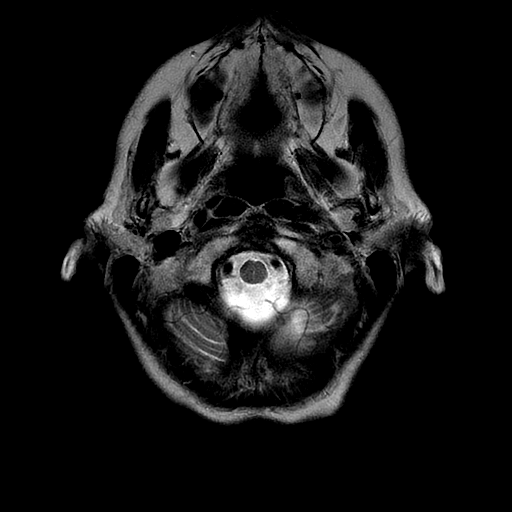
[im 25/25]
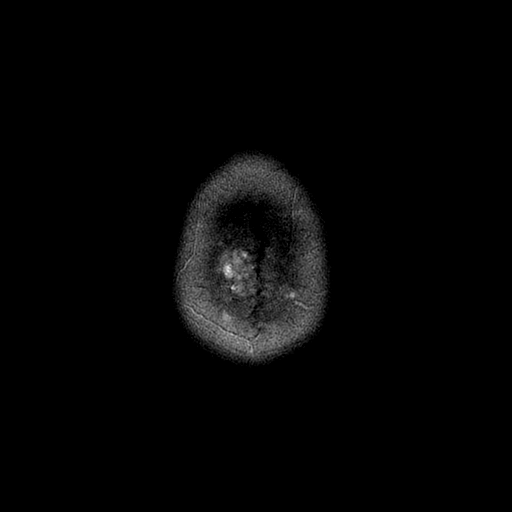

[Series 8: FLAIR · axial · 3.0mm · 0.43mm/px · z∈[-29,+113]mm · 2 of 25 slices shown]
[im 1/25]
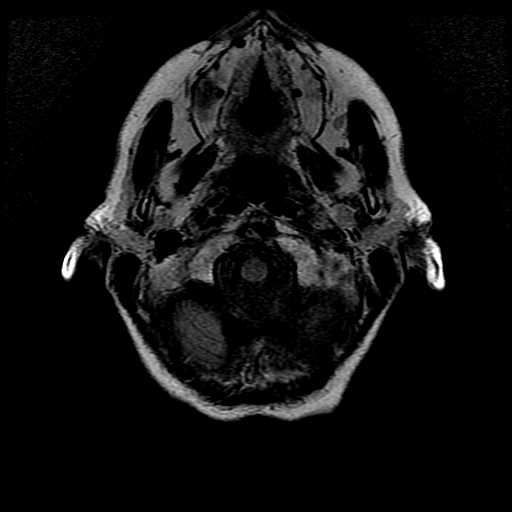
[im 25/25]
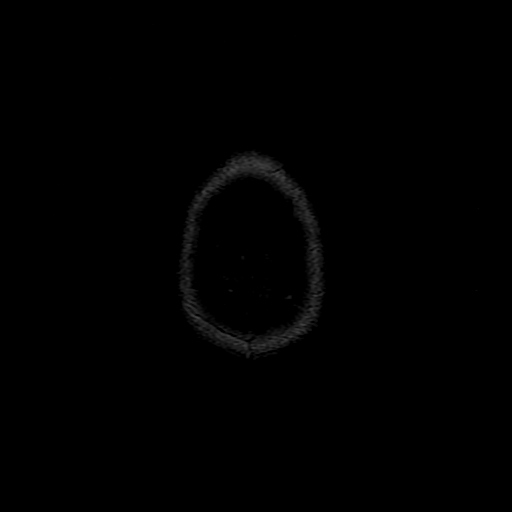

[Series 10: T1 · axial · 3.0mm · 0.47mm/px · z∈[-38,+45]mm · 5 of 100 slices shown (2 of 2)]
[im 1/100]
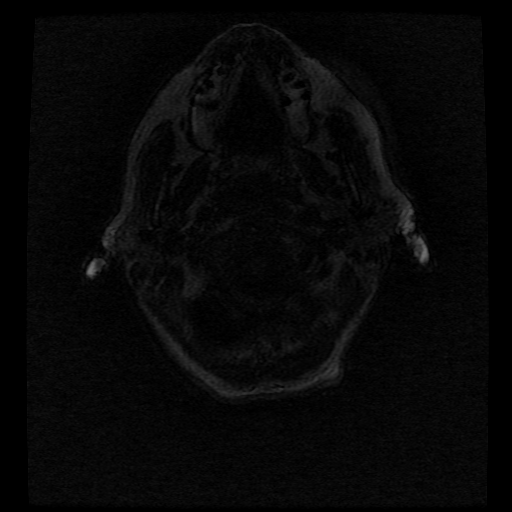
[im 15/100]
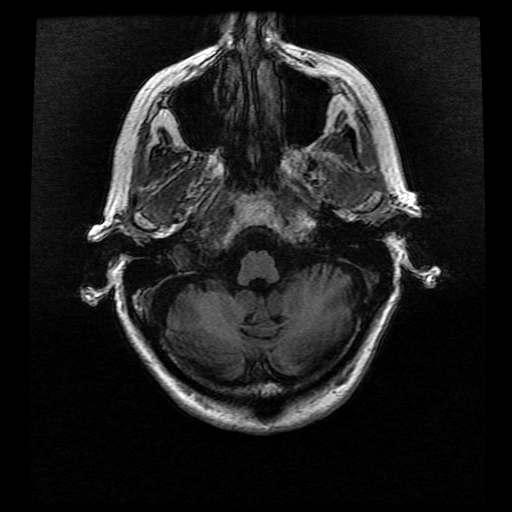
[im 29/100]
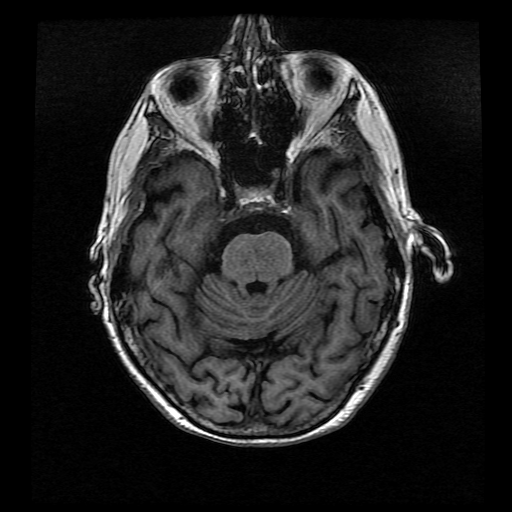
[im 43/100]
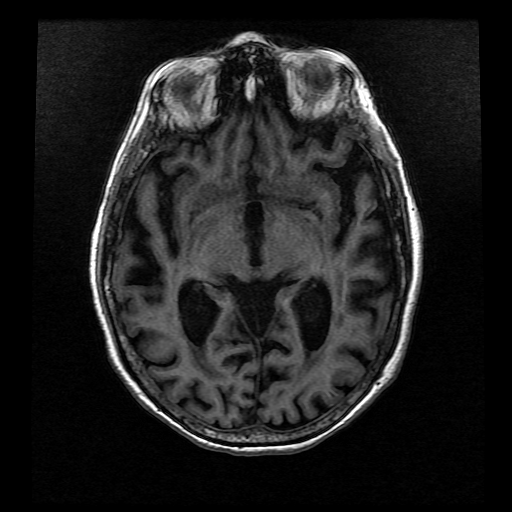
[im 57/100]
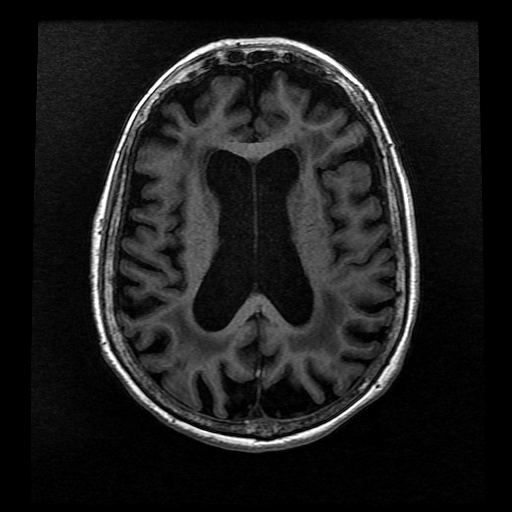

[Series 11: T2 · coronal · 5.0mm · 0.39mm/px · 2 of 25 slices shown (2 of 2)]
[im 1/25]
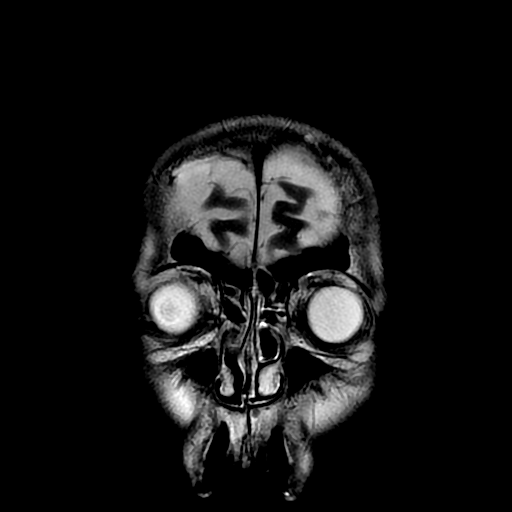
[im 25/25]
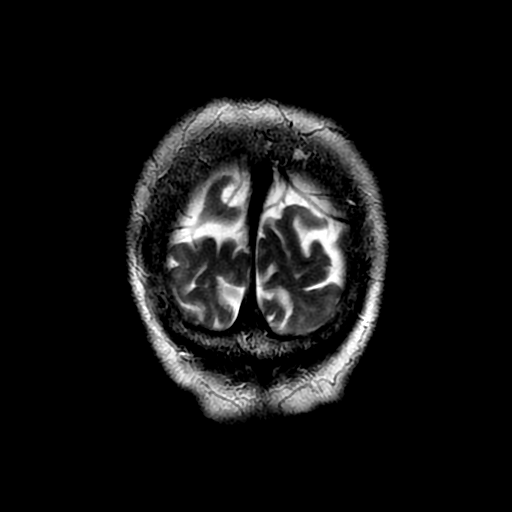

[Series 300: DWI · axial · 3.0mm · 1.09mm/px · z∈[-30,+100]mm · 4 of 45 slices shown (3 of 4)]
[im 1/45]
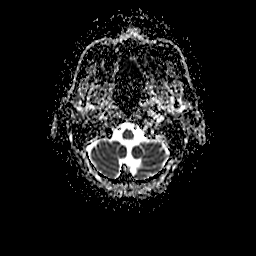
[im 15/45]
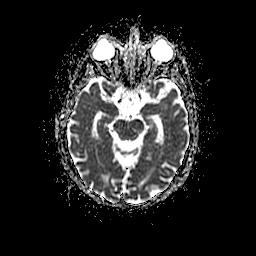
[im 30/45]
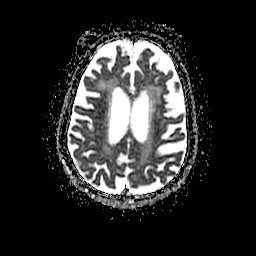
[im 45/45]
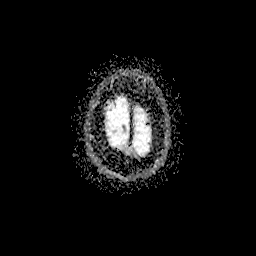

[Series 500: DWI · coronal · 5.0mm · 1.09mm/px · 3 of 33 slices shown (4 of 4)]
[im 1/33]
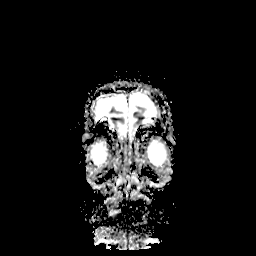
[im 17/33]
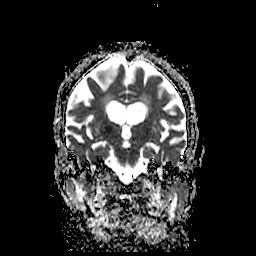
[im 33/33]
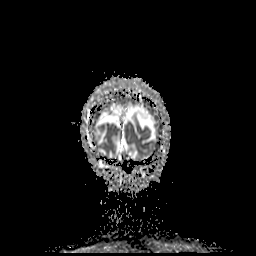

[32 of 48 positions shown; findings below may reference images not displayed]

FINDINGS: MRI HEAD FINDINGS

Brain: Small acute infarction at the right lateral
thalamus/posterior limb internal capsule. No evidence of hemorrhage
or mass effect. No other acute finding. Chronic small-vessel
ischemic changes affect the pons. Few old small vessel cerebellar
infarctions. Cerebral hemispheres show generalized atrophy with
moderate to marked chronic small-vessel ischemic changes of the
white matter. No cortical or large vessel territory infarction. No
mass, hemorrhage, hydrocephalus or extra-axial collection.

Vascular: Major vessels at the base of the brain show flow.

Skull and upper cervical spine: Negative

Sinuses/Orbits: Clear/normal

Other: None

MRA HEAD FINDINGS

Both internal carotid arteries are widely patent into the brain. No
siphon stenosis. The anterior and middle cerebral vessels are patent
without proximal stenosis, aneurysm or vascular malformation.

Both vertebral arteries are widely patent to the basilar. No basilar
stenosis. Posterior circulation branch vessels appear normal.
IMPRESSION: 1. Small acute infarction at the right lateral thalamus/posterior
limb internal capsule. No hemorrhage or mass effect.
2. Chronic small-vessel ischemic changes elsewhere throughout the
brain as outlined above. Generalized brain atrophy.
3. Normal intracranial MR angiography of the large and medium size
vessels.

## 2020-04-07 IMAGING — MR MR HEAD W/O CM
9 of 11 series · 32 of 48 positions shown · non-contrast
Comparison: Head CT yesterday.

CLINICAL DATA: Acute onset of left-sided weakness.

EXAM:
MRI HEAD WITHOUT CONTRAST
MRA HEAD WITHOUT CONTRAST
TECHNIQUE: Multiplanar, multiecho pulse sequences of the brain and surrounding
structures were obtained without intravenous contrast. Angiographic
images of the head were obtained using MRA technique without
contrast.

[Series 3: DWI · axial · 3.0mm · 1.09mm/px · z∈[-30,+100]mm · 7 of 90 slices shown (1 of 4)]
[im 1/90]
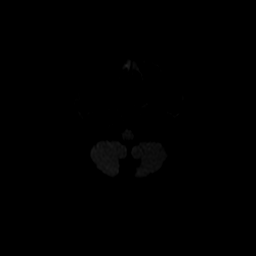
[im 15/90]
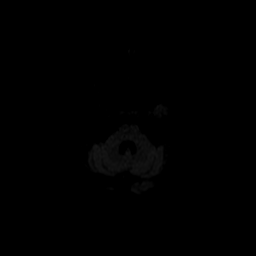
[im 30/90]
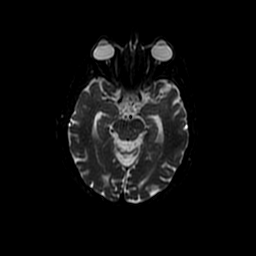
[im 45/90]
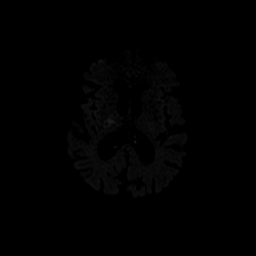
[im 60/90]
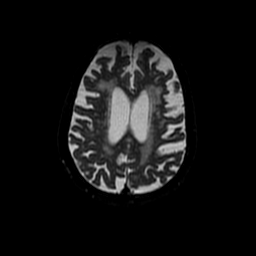
[im 75/90]
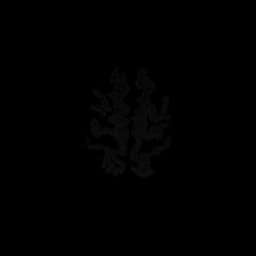
[im 90/90]
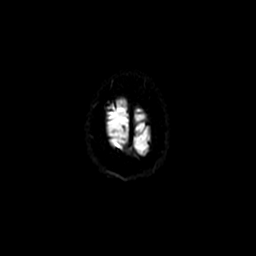

[Series 5: DWI · coronal · 5.0mm · 1.09mm/px · 5 of 66 slices shown (2 of 4)]
[im 1/66]
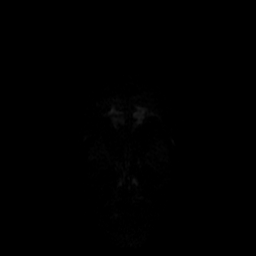
[im 17/66]
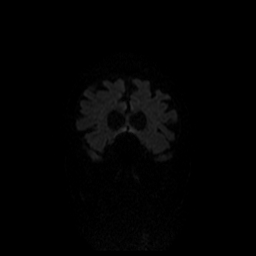
[im 33/66]
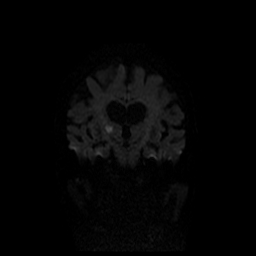
[im 49/66]
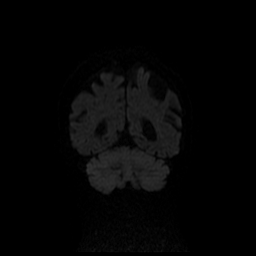
[im 66/66]
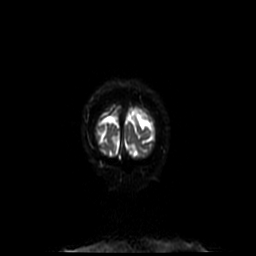

[Series 6: T1 · sagittal · 5.0mm · 0.47mm/px · 2 of 23 slices shown (1 of 2)]
[im 1/23]
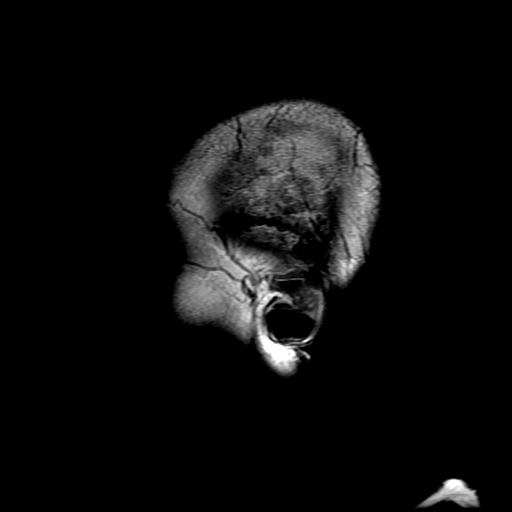
[im 23/23]
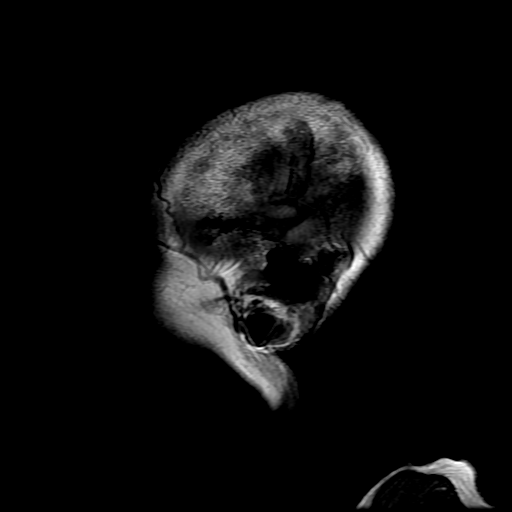

[Series 7: T2 · axial · 5.0mm · 0.43mm/px · z∈[-29,+113]mm · 2 of 25 slices shown (1 of 2)]
[im 1/25]
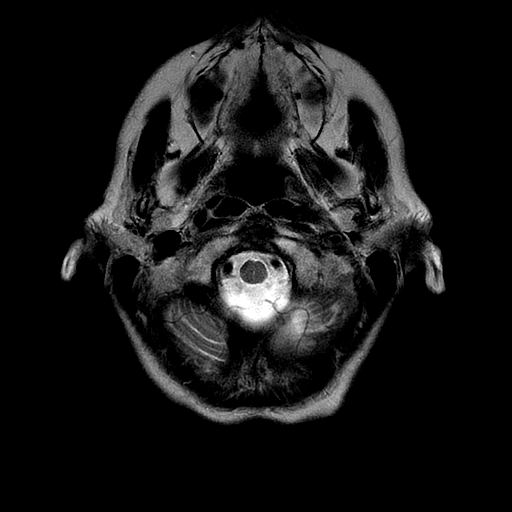
[im 25/25]
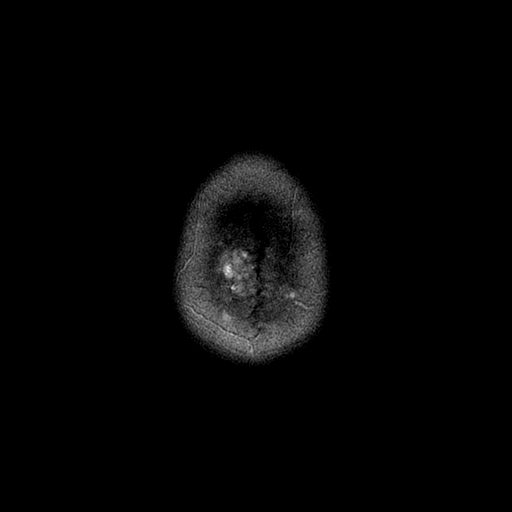

[Series 8: FLAIR · axial · 3.0mm · 0.43mm/px · z∈[-29,+113]mm · 2 of 25 slices shown]
[im 1/25]
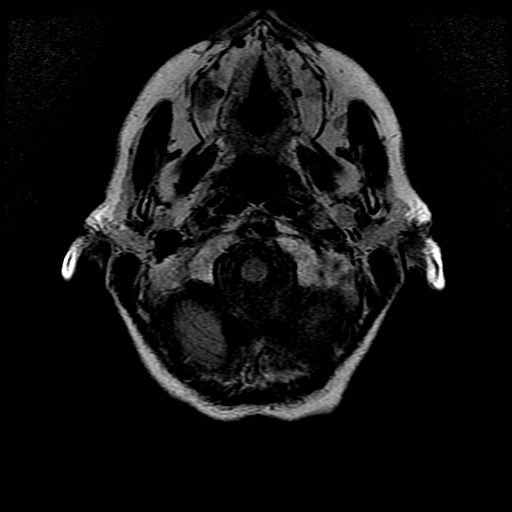
[im 25/25]
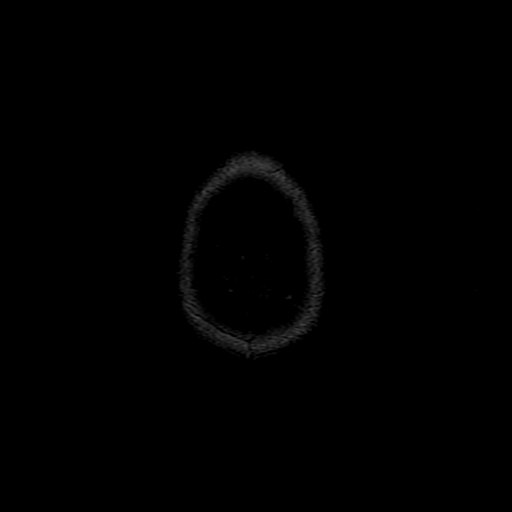

[Series 10: T1 · axial · 3.0mm · 0.47mm/px · z∈[-38,+45]mm · 5 of 100 slices shown (2 of 2)]
[im 1/100]
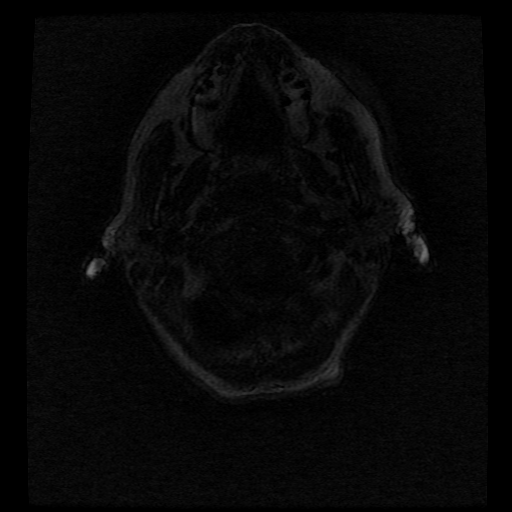
[im 15/100]
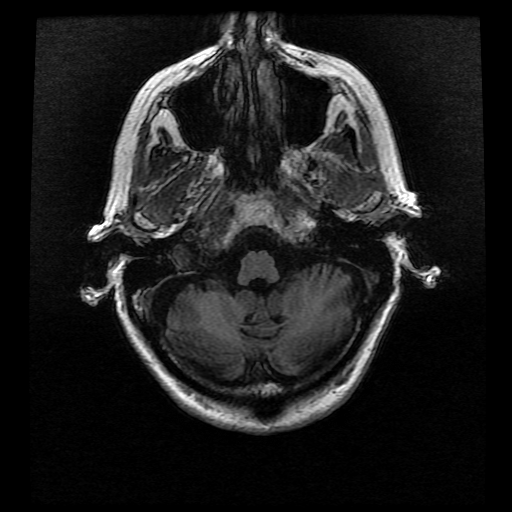
[im 29/100]
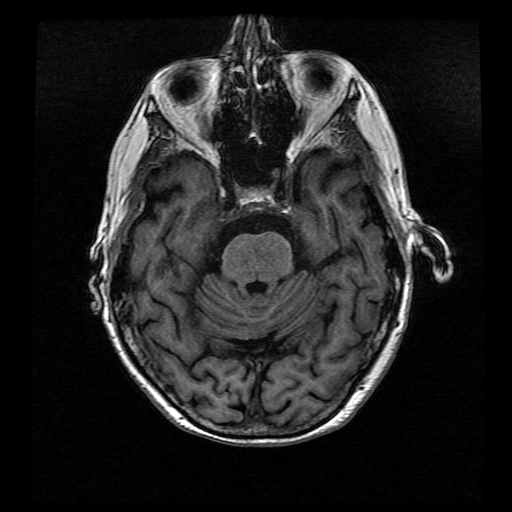
[im 43/100]
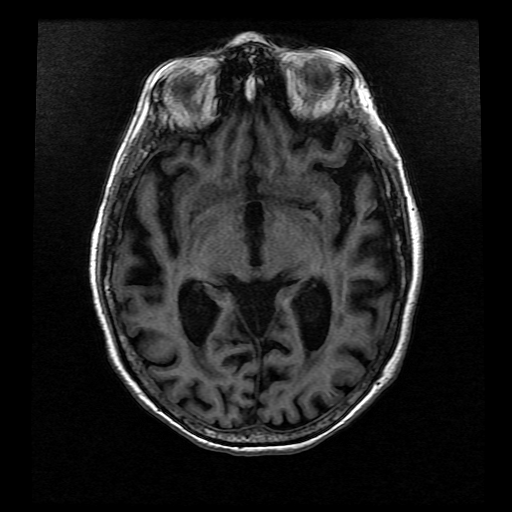
[im 57/100]
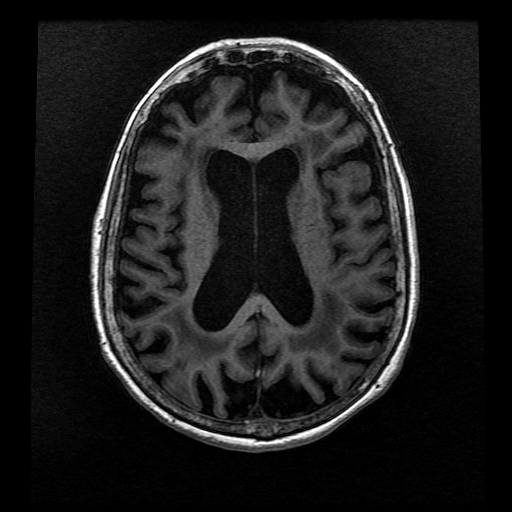

[Series 11: T2 · coronal · 5.0mm · 0.39mm/px · 2 of 25 slices shown (2 of 2)]
[im 1/25]
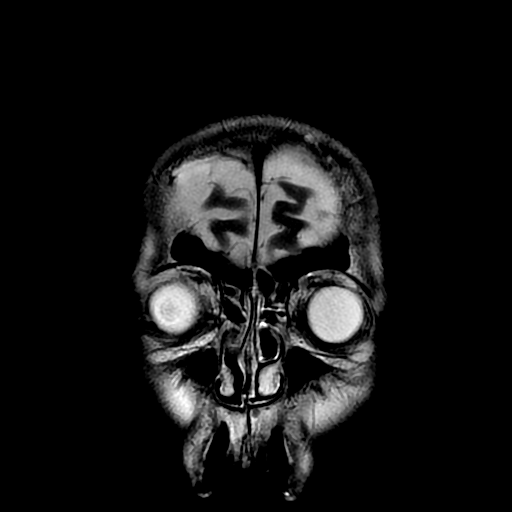
[im 25/25]
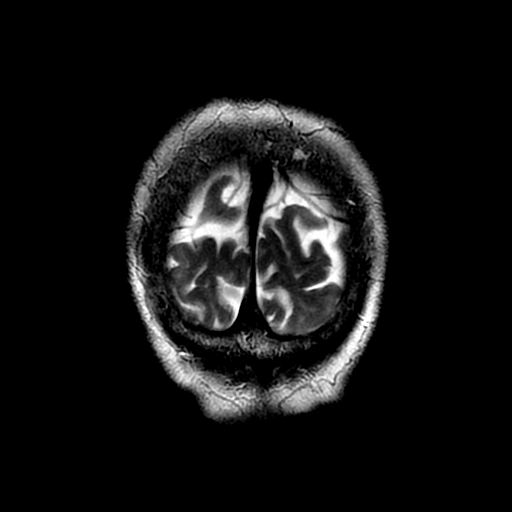

[Series 300: DWI · axial · 3.0mm · 1.09mm/px · z∈[-30,+100]mm · 4 of 45 slices shown (3 of 4)]
[im 1/45]
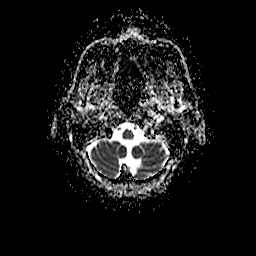
[im 15/45]
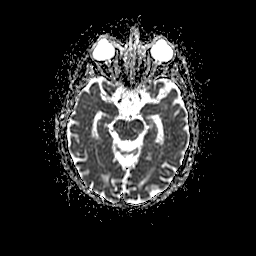
[im 30/45]
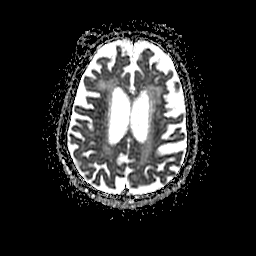
[im 45/45]
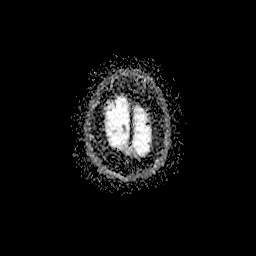

[Series 500: DWI · coronal · 5.0mm · 1.09mm/px · 3 of 33 slices shown (4 of 4)]
[im 1/33]
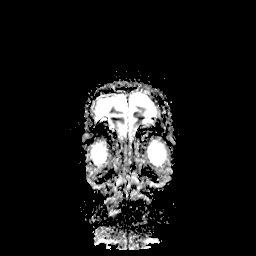
[im 17/33]
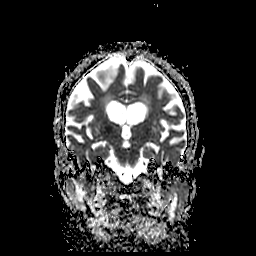
[im 33/33]
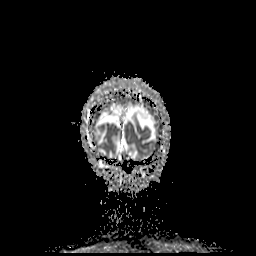

[32 of 48 positions shown; findings below may reference images not displayed]

FINDINGS: MRI HEAD FINDINGS

Brain: Small acute infarction at the right lateral
thalamus/posterior limb internal capsule. No evidence of hemorrhage
or mass effect. No other acute finding. Chronic small-vessel
ischemic changes affect the pons. Few old small vessel cerebellar
infarctions. Cerebral hemispheres show generalized atrophy with
moderate to marked chronic small-vessel ischemic changes of the
white matter. No cortical or large vessel territory infarction. No
mass, hemorrhage, hydrocephalus or extra-axial collection.

Vascular: Major vessels at the base of the brain show flow.

Skull and upper cervical spine: Negative

Sinuses/Orbits: Clear/normal

Other: None

MRA HEAD FINDINGS

Both internal carotid arteries are widely patent into the brain. No
siphon stenosis. The anterior and middle cerebral vessels are patent
without proximal stenosis, aneurysm or vascular malformation.

Both vertebral arteries are widely patent to the basilar. No basilar
stenosis. Posterior circulation branch vessels appear normal.
IMPRESSION: 1. Small acute infarction at the right lateral thalamus/posterior
limb internal capsule. No hemorrhage or mass effect.
2. Chronic small-vessel ischemic changes elsewhere throughout the
brain as outlined above. Generalized brain atrophy.
3. Normal intracranial MR angiography of the large and medium size
vessels.

## 2020-04-07 MED ORDER — CLOPIDOGREL BISULFATE 75 MG PO TABS
75.0000 mg | ORAL_TABLET | Freq: Every day | ORAL | Status: DC
  Administered 2020-04-07 – 2020-04-08 (×2): 75 mg via ORAL
  Filled 2020-04-07 (×2): qty 1

## 2020-04-07 MED ORDER — LEVOTHYROXINE SODIUM 50 MCG PO TABS
50.0000 ug | ORAL_TABLET | Freq: Every day | ORAL | Status: DC
  Administered 2020-04-07 – 2020-04-08 (×2): 50 ug via ORAL
  Filled 2020-04-07 (×2): qty 1

## 2020-04-07 MED ORDER — VITAMIN D 25 MCG (1000 UNIT) PO TABS
1000.0000 [IU] | ORAL_TABLET | Freq: Every day | ORAL | Status: DC
  Administered 2020-04-07 – 2020-04-08 (×2): 1000 [IU] via ORAL
  Filled 2020-04-07 (×2): qty 1

## 2020-04-07 MED ORDER — ASPIRIN EC 81 MG PO TBEC
81.0000 mg | DELAYED_RELEASE_TABLET | Freq: Every day | ORAL | Status: DC
  Administered 2020-04-07 – 2020-04-08 (×2): 81 mg via ORAL
  Filled 2020-04-07 (×2): qty 1

## 2020-04-07 MED ORDER — ENOXAPARIN SODIUM 30 MG/0.3ML ~~LOC~~ SOLN
30.0000 mg | SUBCUTANEOUS | Status: DC
  Administered 2020-04-07: 30 mg via SUBCUTANEOUS
  Filled 2020-04-07: qty 0.3

## 2020-04-07 MED ORDER — PANTOPRAZOLE SODIUM 40 MG PO TBEC
40.0000 mg | DELAYED_RELEASE_TABLET | Freq: Every day | ORAL | Status: DC
  Administered 2020-04-07 – 2020-04-08 (×2): 40 mg via ORAL
  Filled 2020-04-07 (×2): qty 1

## 2020-04-07 MED ORDER — ATORVASTATIN CALCIUM 10 MG PO TABS
20.0000 mg | ORAL_TABLET | Freq: Every day | ORAL | Status: DC
  Administered 2020-04-07 – 2020-04-08 (×2): 20 mg via ORAL
  Filled 2020-04-07 (×2): qty 2

## 2020-04-07 NOTE — Evaluation (Signed)
Occupational Therapy Evaluation Patient Details Name: Shelley Sloan MRN: [id number] DOB: November 15, 1923 Today's Date: 04/07/2020    History of Present Illness Pt is a 87 yo female s/p adm 4/4 with L sided weakness. MRI confirms Acute infarct R lateral thalamus/posterior limb internal capsule. Pt had tPA at 12pm 4/4. PMHx: HTN, arthritis.   Clinical Impression   Pt PTA: Pt reports independence with ADL and mobility with SPC or RW at home (ALF). Pt currently, transfers overall for safety with RW; dizziness with positional change Pt limited by decreased strength, decreased ability to care for self and decreased activity tolerance. Pt able to take a few steps, but dizziness in sitting and stand, did not appear to be orthostatic with BP159/75 in standing. Pt's daughter from out of town in room. Pt minA for mobility and minA for ADL tasks. Pt would benefit from continued OT skilled services. OT following acutely for safety with ADL and mobility.     Follow Up Recommendations  SNF    Equipment Recommendations  None recommended by OT    Recommendations for Other Services       Precautions / Restrictions Precautions Precautions: Fall;Other (comment) Precaution Comments: dizziness in positional change (chronic) Restrictions Weight Bearing Restrictions: No      Mobility Bed Mobility Overal bed mobility: Needs Assistance Bed Mobility: Rolling;Sidelying to Sit Rolling: Min assist Sidelying to sit: Min assist       General bed mobility comments: Pt up on EOB    Transfers Overall transfer level: Needs assistance Equipment used: Rolling walker (2 wheeled) Transfers: Sit to/from [employer] Sit to Stand: Min assist Stand pivot transfers: Min assist       General transfer comment: Assist to bring hips up and for balance. Verbal cues for hand placement. Bed to chair with pivotal steps with walker with assist for balance and support. Pt with difficulty with placement of  lt foot with pivoting.    Balance Overall balance assessment: Needs assistance Sitting-balance support: No upper extremity supported Sitting balance-Leahy Scale: Good     Standing balance support: Bilateral upper extremity supported Standing balance-Leahy Scale: Poor Standing balance comment: need for for UE external support                           ADL either performed or assessed with clinical judgement   ADL Overall ADL's : Needs assistance/impaired Eating/Feeding: Set up;Sitting   Grooming: Set up;Sitting   Upper Body Bathing: Set up;Sitting   Lower Body Bathing: Minimal assistance;Sitting/lateral leans;Sit to/from stand;Cueing for safety   Upper Body Dressing : Set up;Sitting   Lower Body Dressing: Moderate assistance;Sitting/lateral leans;Bed level Lower Body Dressing Details (indicate cue type and reason): Pt ued to low bed and certain way of donning/doffing socks Toilet Transfer: Minimal assistance;Stand-pivot;RW;BSC Toilet Transfer Details (indicate cue type and reason): simulated with Reclinerl minA overall for safety with RW; dizziness with positional change Toileting- Clothing Manipulation and Hygiene: Minimal assistance;Cueing for safety;Sitting/lateral lean;Sit to/from stand       Functional mobility during ADLs: Minimal assistance;Rolling walker;Cueing for safety General ADL Comments: Pt limited by decreased strength, decreased ability to care for self and decreased activity tolerance. Pt able to take a few steps, but dizziness in sitting and stand, did not apepar to be orthostatic with BP159/75 in standing. Pt's daughter from out of town in room.     Vision Baseline Vision/History: Wears glasses Wears Glasses: At all times Patient Visual Report: No change from  baseline Vision Assessment?: No apparent visual deficits     Perception     Praxis      Pertinent Vitals/Pain Pain Assessment: No/denies pain Pain Score: 0-No pain Pain  Intervention(s): Monitored during session     Hand Dominance Right   Extremity/Trunk Assessment Upper Extremity Assessment Upper Extremity Assessment: Defer to OT evaluation RUE Deficits / Details: 3+/5 MM grade RUE Sensation: WNL RUE Coordination: WNL LUE Deficits / Details: 3+/5 MM grade LUE Sensation: WNL LUE Coordination: WNL   Lower Extremity Assessment Lower Extremity Assessment: LLE deficits/detail LLE Deficits / Details: Pt with 5/5 strength with manual muscle testing but weakness vs decr proprioception when used functionally. LLE Coordination: decreased gross motor   Cervical / Trunk Assessment Cervical / Trunk Assessment: Kyphotic   Communication Communication Communication: HOH   Cognition Arousal/Alertness: Awake/alert Behavior During Therapy: WFL for tasks assessed/performed Overall Cognitive Status: Within Functional Limits for tasks assessed                                     General Comments  VSS on RA    Exercises     Shoulder Instructions      Home Living Family/patient expects to be discharged to:: Assisted living                             Home Equipment: Walker - 2 wheels;Cane - single point;Shower seat;Grab bars - toilet;Grab bars - tub/shower   Additional Comments: pt will d/c to [facility] first and then return to [facility]      Prior Functioning/Environment Level of Independence: Independent with assistive device(s)        Comments: Pt reports independence with ADL and mobility with SPC or RW at home. Family lives nearby to assist. Amb to dining room for meals.        OT Problem List: Decreased strength;Decreased activity tolerance;Impaired balance (sitting and/or standing);Decreased safety awareness      OT Treatment/Interventions: Self-care/ADL training;Therapeutic exercise;Neuromuscular education;Energy conservation;Therapeutic activities;Cognitive  remediation/compensation;Visual/perceptual remediation/compensation;Patient/family education;Balance training    OT Goals(Current goals can be found in the care plan section) Acute Rehab OT Goals Patient Stated Goal: to go to [facility] first then home OT Goal Formulation: With patient Time For Goal Achievement: 04/21/20 Potential to Achieve Goals: Good  OT Frequency: Min 2X/week   Barriers to D/C:            Co-evaluation              AM-PAC OT "6 Clicks" Daily Activity     Outcome Measure Help from another person eating meals?: None Help from another person taking care of personal grooming?: A Little Help from another person toileting, which includes using toliet, bedpan, or urinal?: A Little Help from another person bathing (including washing, rinsing, drying)?: A Little Help from another person to put on and taking off regular upper body clothing?: None Help from another person to put on and taking off regular lower body clothing?: A Little 6 Click Score: 20   End of Session Equipment Utilized During Treatment: Gait belt Nurse Communication: Mobility status  Activity Tolerance: Patient tolerated treatment well Patient left: in chair;with call bell/phone within reach;with family/visitor present;Other (comment) (PT in room with pt (chair alarm ready to be set))  OT Visit Diagnosis: Unsteadiness on feet (R26.81);Muscle weakness (generalized) (M62.81)  Time: 1225-1250 OT Time Calculation (min): 25 min Charges:  OT General Charges $OT Visit: 1 Visit OT Evaluation $OT Eval Moderate Complexity: 1 Mod OT Treatments $Self Care/Home Management : 8-22 mins  Flora Lipps, OTR/L Acute Rehabilitation Services Pager: [phone] Office: [phone]   Gaege Sangalang C 04/07/2020, 2:37 PM

## 2020-04-07 NOTE — Progress Notes (Addendum)
STROKE TEAM PROGRESS NOTE  Shelley Sloan is a 87 y.o. female past medical history of hypertension, arthritis, living at an assisted living facility, last known well at 9 AM when she woke up and went to the bathroom without any problems, went back to take a nap and at around 10 AM noticed that her left side was weaker. She noticed weakness in the arm and leg on both sides.  The weakness did not subside and she also felt some subjective sense of heaviness and numbness on that side.  EMS was called.  On their evaluation, she had some left-sided arm and leg drift.  Code stroke was activated and she was brought in for emergent stroke evaluation. On the bridge, her NIH stroke scale was 3. Noncontrast head CT without evidence of bleed. S/p tPA 12:07 pm on  4/4. NIHSS was 1 post tPA. On exam today patient reports feeling better, but still reports weakness in her LUE. Patient reports that she has not been able to get out of bed to walk, but her BLE feel fine in the bed.     Vitals:   04/07/20 0500 04/07/20 0600 04/07/20 0700 04/07/20 0723  BP: (!) 174/86 (!) 181/57 (!) 162/65   Pulse: 69 72 70   Resp: Temp:    97.9 F (36.6 C)  TempSrc:    Oral  SpO2: 93% 96%    Weight:      Height:       CBC:  Recent Labs  Lab 04/06/20 1150 04/06/20 1159  WBC 5.7  --   NEUTROABS 3.4  --   HGB 13.1 12.9  HCT 41.2 38.0  MCV 94.5  --   PLT 242  --    Basic Metabolic Panel:  Recent Labs  Lab 04/06/20 1150 04/06/20 1159  NA 140 140  K 3.9 4.0  CL 106 107  CO2 25  --   GLUCOSE 104* 102*  BUN 20 28*  CREATININE 0.61 0.60  CALCIUM 9.0  --    Lipid Panel:  Recent Labs  Lab 04/07/20 0150  CHOL 170  TRIG 80  HDL 58  CHOLHDL 2.9  VLDL 16  LDLCALC 96   HgbA1c:  Recent Labs  Lab 04/07/20 0150  HGBA1C 5.8*   Urine Drug Screen: No results for input(s): LABOPIA, COCAINSCRNUR, LABBENZ, AMPHETMU, THCU, LABBARB in the last 168 hours.  Alcohol Level No results for input(s): ETH in  the last 168 hours.  IMAGING past 24 hours ECHOCARDIOGRAM COMPLETE  Result Date: 04/06/2020    ECHOCARDIOGRAM REPORT   Patient Name:   LADEJA PELHAM Date of Exam: 04/06/2020 Medical Rec #:  [id number]        Height:       63.0 in Accession #:    [id number]       Weight:       126.5 lb Date of Birth:  05-31-1923        BSA:          1.592 m Patient Age:    89 years         BP:           156/54 mmHg Patient Gender: F                HR:           63 bpm. Exam Location:  Inpatient Procedure: 2D Echo Indications:    Stroke I63.9  History:  Patient has no prior history of Echocardiogram examinations.                 Risk Factors:Hypertension.  Sonographer:    Thurman Coyer RDCS (AE) Referring Phys: [id number] ASHISH ARORA IMPRESSIONS  1. Left ventricular ejection fraction, by estimation, is 60 to 65%. The left ventricle has normal function. The left ventricle has no regional wall motion abnormalities. There is mild asymmetric left ventricular hypertrophy of the basal and septal segments. Left ventricular diastolic parameters were normal.  2. Right ventricular systolic function is normal. The right ventricular size is normal. There is normal pulmonary artery systolic pressure.  3. The mitral valve is normal in structure. No evidence of mitral valve regurgitation. No evidence of mitral stenosis.  4. The aortic valve is tricuspid. There is mild calcification of the aortic valve. Aortic valve regurgitation is not visualized. Mild aortic valve sclerosis is present, with no evidence of aortic valve stenosis.  5. The inferior vena cava is normal in size with greater than 50% respiratory variability, suggesting right atrial pressure of 3 mmHg. FINDINGS  Left Ventricle: Left ventricular ejection fraction, by estimation, is 60 to 65%. The left ventricle has normal function. The left ventricle has no regional wall motion abnormalities. The left ventricular internal cavity size was normal in size. There is  mild asymmetric  left ventricular hypertrophy of the basal and septal segments. Left ventricular diastolic parameters were normal. Right Ventricle: The right ventricular size is normal. No increase in right ventricular wall thickness. Right ventricular systolic function is normal. There is normal pulmonary artery systolic pressure. The tricuspid regurgitant velocity is 2.16 m/s, and  with an assumed right atrial pressure of 3 mmHg, the estimated right ventricular systolic pressure is 21.7 mmHg. Left Atrium: Left atrial size was normal in size. Right Atrium: Right atrial size was normal in size. Pericardium: There is no evidence of pericardial effusion. Mitral Valve: The mitral valve is normal in structure. No evidence of mitral valve regurgitation. No evidence of mitral valve stenosis. Tricuspid Valve: The tricuspid valve is normal in structure. Tricuspid valve regurgitation is mild . No evidence of tricuspid stenosis. Aortic Valve: The aortic valve is tricuspid. There is mild calcification of the aortic valve. Aortic valve regurgitation is not visualized. Aortic regurgitation PHT measures 365 msec. Mild aortic valve sclerosis is present, with no evidence of aortic valve stenosis. Pulmonic Valve: The pulmonic valve was normal in structure. Pulmonic valve regurgitation is not visualized. No evidence of pulmonic stenosis. Aorta: The aortic root is normal in size and structure. Venous: The inferior vena cava is normal in size with greater than 50% respiratory variability, suggesting right atrial pressure of 3 mmHg. IAS/Shunts: No atrial level shunt detected by color flow Doppler.  LEFT VENTRICLE PLAX 2D LVIDd:         3.60 cm  Diastology LVIDs:         2.20 cm  LV e' medial:    7.51 cm/s LV PW:         1.10 cm  LV E/e' medial:  12.1 LV IVS:        1.20 cm  LV e' lateral:   7.62 cm/s LVOT diam:     2.10 cm  LV E/e' lateral: 11.9 LV SV:         80 LV SV Index:   50 LVOT Area:     3.46 cm  RIGHT VENTRICLE RV S prime:     12.40 cm/s TAPSE  (M-mode): 2.2 cm LEFT  ATRIUM             Index       RIGHT ATRIUM           Index LA diam:        3.20 cm 2.01 cm/m  RA Area:     15.20 cm LA Vol (A2C):   27.5 ml 17.28 ml/m RA Volume:   36.90 ml  23.18 ml/m LA Vol (A4C):   32.4 ml 20.35 ml/m LA Biplane Vol: 30.5 ml 19.16 ml/m  AORTIC VALVE LVOT Vmax:   85.80 cm/s LVOT Vmean:  58.200 cm/s LVOT VTI:    0.232 m AI PHT:      365 msec  AORTA Ao Root diam: 3.20 cm MITRAL VALVE                TRICUSPID VALVE MV Area (PHT): 2.66 cm     TR Peak grad:   18.7 mmHg MV Decel Time: 285 msec     TR Vmax:        216.00 cm/s MV E velocity: 90.80 cm/s MV A velocity: 113.00 cm/s  SHUNTS MV E/A ratio:  0.80         Systemic VTI:  0.23 m                             Systemic Diam: 2.10 cm Charlton Haws MD Electronically signed by Charlton Haws MD Signature Date/Time: 04/06/2020/2:05:07 PM    Final    CT HEAD CODE STROKE WO CONTRAST  Result Date: 04/06/2020 CLINICAL DATA:  Code stroke. EXAM: CT HEAD WITHOUT CONTRAST TECHNIQUE: Contiguous axial images were obtained from the base of the skull through the vertex without intravenous contrast. COMPARISON:  None. FINDINGS: Brain: There is no acute intracranial hemorrhage. Confluent areas of hypoattenuation in the supratentorial greater than pontine white matter are nonspecific probably reflect moderate to advanced chronic microvascular ischemic changes. Prominence of the ventricles and sulci reflects generalized parenchymal volume loss. Gray-white differentiation is preserved. There is no extra-axial fluid collection. Vascular: No hyperdense vessel. There is intracranial atherosclerotic calcification at the skull base. Skull: Unremarkable. Sinuses/Orbits: Minor mucosal thickening. No significant orbital abnormality. Other: Mastoid air cells are clear. ASPECTS (Alberta Stroke Program Early CT Score) - Ganglionic level infarction (caudate, lentiform nuclei, internal capsule, insula, M1-M3 cortex): 7 - Supraganglionic infarction (M4-M6  cortex): 3 Total score (0-10 with 10 being normal): 10 IMPRESSION: There is no acute intracranial hemorrhage or evidence of acute infarction. ASPECT score is 10. Moderate to advanced chronic microvascular ischemic changes. These results were communicated to Dr. Wilford Corner at 12:04 pm on 04/06/2020 by text page via the [facility] messaging system. Electronically Signed   By: Guadlupe Spanish M.D.   On: 04/06/2020 12:09   VAS US CAROTID  Result Date: 04/06/2020 Carotid Arterial Duplex Study Indications:       Weakness and Left side hemiparesis (arm & leg). Risk Factors:      Hypertension, past history of smoking. Comparison Study:  No previous exams Performing Technologist: Ernestene Mention  Examination Guidelines: A complete evaluation includes B-mode imaging, spectral Doppler, color Doppler, and power Doppler as needed of all accessible portions of each vessel. Bilateral testing is considered an integral part of a complete examination. Limited examinations for reoccurring indications may be performed as noted.  Right Carotid Findings: +----------+--------+--------+--------+------------------+------------------+           PSV cm/sEDV cm/sStenosisPlaque DescriptionComments           +----------+--------+--------+--------+------------------+------------------+  CCA Prox  59      13                                intimal thickening +----------+--------+--------+--------+------------------+------------------+ CCA Distal47      5                                                    +----------+--------+--------+--------+------------------+------------------+ ICA Prox  61      12                                                   +----------+--------+--------+--------+------------------+------------------+ ICA Distal61      15                                                   +----------+--------+--------+--------+------------------+------------------+ ECA       87                                                            +----------+--------+--------+--------+------------------+------------------+ +----------+--------+-------+----------------+-------------------+           PSV cm/sEDV cmsDescribe        Arm Pressure (mmHG) +----------+--------+-------+----------------+-------------------+ [id number]            Multiphasic, WNL                    +----------+--------+-------+----------------+-------------------+ +---------+--------+--+--------+-+---------+ VertebralPSV cm/s49EDV cm/s7Antegrade +---------+--------+--+--------+-+---------+  Left Carotid Findings: +----------+--------+-------+--------+----------------------+------------------+           PSV cm/sEDV    StenosisPlaque Description    Comments                             cm/s                                                    +----------+--------+-------+--------+----------------------+------------------+ CCA Prox  63      10                                   intimal thickening +----------+--------+-------+--------+----------------------+------------------+ CCA Distal55      11             smooth and            intimal thickening                                  heterogenous                             +----------+--------+-------+--------+----------------------+------------------+  ICA Prox  65      12                                                      +----------+--------+-------+--------+----------------------+------------------+ ICA Distal71      16                                                      +----------+--------+-------+--------+----------------------+------------------+ ECA       65                                                              +----------+--------+-------+--------+----------------------+------------------+ +----------+--------+--------+----------------+-------------------+           PSV cm/sEDV cm/sDescribe        Arm Pressure (mmHG)  +----------+--------+--------+----------------+-------------------+ [id number]             Multiphasic, WNL                    +----------+--------+--------+----------------+-------------------+ +---------+--------+--+--------+--+---------+ VertebralPSV cm/s50EDV cm/s10Antegrade +---------+--------+--+--------+--+---------+   Summary: Right Carotid: The extracranial vessels were near-normal with only minimal wall                thickening or plaque. Left Carotid: The extracranial vessels were near-normal with only minimal wall               thickening or plaque. Vertebrals:  Bilateral vertebral arteries demonstrate antegrade flow. Subclavians: Normal flow hemodynamics were seen in bilateral subclavian              arteries. *See table(s) above for measurements and observations.     Preliminary     PHYSICAL EXAM  GENERAL: Awake, alert in NAD HEENT: - Normocephalic and atraumatic, dry mm LUNGS - Chest movement is symmetrical and patient appears to be breathing well with no signs of distress CV - RRR Ext: warm, well perfused, intact peripheral pulses, no edema  NEURO:  Mental Status: Alert to person, place, time, and situation. Patient is pleasant and able to converse about her care. Language: speech is clear and not dysarthric.  Naming, repetition, fluency, and comprehension intact.  Cranial Nerves: PERRL, EOMI, visual fields full, no facial asymmetry, facial sensation intact, hearing reduced bilaterally (wears hearing aids at baseline, but does not have them at the moment), tongue/uvula/soft palate midline, normal sternocleidomastoid and trapezius muscle strength. No evidence of tongue atrophy or fibrillations Motor: Left upper had mild ataxia on pronator drift and some weakness ,4 /5 and lower extremity 4/5 strength.  Right upper and lower extremity 5/5 strength. Tone: is normal and bulk is normal Sensation-intact in all extremities.  Facial sensation preserved  bilaterally Coordination: FTN intact bilaterally, with some ataxia in LUE. Gait- deferred  ASSESSMENT/PLAN Ms. ADAJA WANDER is a 87 y.o. female with history of hypertension, arthritis, living at an assisted living facility who presented with left sided weakness. Patient received tPA CT was negative for acute intracranial infarction as well. Patient Carotid Dopplers concluded that patient has near normal  patency of her carotids, bilaterally. Echo was negative for abnormalities. MRA concluded patency of internal carotid arteries and cerebral vessels are patent w/o AVM or aneurysm. MRI confirmed patient had a small infarct of the post limb of the internal capsule of her R lateral thalamus. This stroke would explain the patient presenting symptoms of left sided weakness. Patient will be assessed by PT/OT.  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   MRI Small acute infarction at the right lateral thalamus/posterior limb internal capsule. No hemorrhage or mass effect.  MRA head normal intracranial MR angiography of the large and medium size vessels.  CUS unremarkable  2D echo EF 60 to 65%  LDL 96  HgbA1c 5.8  VTE prophylaxis -Lovenox  aspirin 81 mg daily prior to admission, now on ASA + Plavix for 3 weeks and the Plavix alone  Therapy recommendations:  pending  Disposition:  Home  Hypertension  Home meds:  amlodipine 5mg   . Permissive hypertension (OK if < 180/105) but gradually normalize in 3-5 days . Long-term BP goal normotensive  Hyperlipidemia  Home meds - none  LDL 96, goal < 70  On atorvastatin 20mg   Continue statin on discharge  Other Stroke Risk Factors  Advanced Age >/= 85   Other active issue   Hypothyroid - Continue home synthroid daily  Hospital day # 1  76, MD PGY-1  ATTENDING NOTE: I reviewed above note and agree with the assessment and plan. Pt was seen and  examined.   87 year old female with history of hypertension admitted for left arm weakness.  Status post TPA and symptoms improved.  CT head and no acute abnormality.  Carotid Doppler and MRA head unremarkable.  MRI showed right lateral thalamus and PLIC small infarct.  LDL 96, A1c 5.8, EF 60 to Eliseo Gum.  Creatinine 0.60.  On exam, patient awake alert, orientated x3, mild dysarthria however no aphasia, able to name and repeat.  No gaze palsy, visual field full.  Mild right nasolabial fold flattening.  Tongue midline.  Left upper extremity 4/5 with pronator drift.  Bilateral lower extremity symmetrical.  Sensation symmetrical, finger-to-nose mild dysmetria on the left.  Etiology for patient stroke likely due to small vessel disease.  After 24 hours TPA, started on aspirin and Plavix DAPT for 3 weeks and then Plavix alone.  Started Lipitor 20 mg for elevated LDL.  PT/OT pending.  For detailed assessment and plan, please refer to above as I have made changes wherever appropriate.   89, MD PhD Stroke Neurology 04/07/2020 6:18 PM  This patient is critically ill due to acute stroke status post TPA and at significant risk of neurological worsening, death form recurrent stroke, hemorrhagic conversion, bleeding from TPA. This patient's care requires constant monitoring of vital signs, hemodynamics, respiratory and cardiac monitoring, review of multiple databases, neurological assessment, discussion with family, other specialists and medical decision making of high complexity. I spent 35 minutes of neurocritical care time in the care of this patient.   To contact Stroke Continuity provider, please refer to Marvel Plan. After hours, contact General Neurology

## 2020-04-07 NOTE — Evaluation (Addendum)
Physical Therapy Evaluation Patient Details Name: Shelley Sloan MRN: [id number] DOB: April 21, 1923 Today's Date: 04/07/2020   History of Present Illness  Pt is a 87 yo female s/p adm 4/4 with L sided weakness. MRI confirms Acute infarct R lateral thalamus/posterior limb internal capsule. Pt had tPA at 12pm 4/4. PMHx: HTN, arthritis.  Clinical Impression  Pt admitted with above diagnosis and presents to PT with functional limitations due to deficits listed below (See PT problem list). Pt needs skilled PT to maximize independence and safety to allow discharge to SNF at [facility]. Expect pt will make steady progress.      Follow Up Recommendations SNF (At [facility])    Equipment Recommendations  None recommended by PT    Recommendations for Other Services       Precautions / Restrictions Precautions Precautions: Fall;Other (comment) Precaution Comments: dizziness in positional change (chronic) Restrictions Weight Bearing Restrictions: No      Mobility  Bed Mobility           General bed mobility comments: Pt up on EOB    Transfers Overall transfer level: Needs assistance Equipment used: Rolling walker (2 wheeled) Transfers: Sit to/from [employer] Sit to Stand: Min assist Stand pivot transfers: Min assist       General transfer comment: Assist to bring hips up and for balance. Verbal cues for hand placement. Bed to chair with pivotal steps with walker with assist for balance and support. Pt with difficulty with placement of lt foot with pivoting.  Ambulation/Gait Ambulation/Gait assistance: Min assist Gait Distance (Feet): 5 Feet Assistive device: Rolling walker (2 wheeled) Gait Pattern/deviations: Step-through pattern;Decreased stride length;Ataxic Gait velocity: decr Gait velocity interpretation: <1.31 ft/sec, indicative of household ambulator General Gait Details: Assist for support and balance. Pt with decr control of  LLE.  Stairs            Wheelchair Mobility    Modified Rankin (Stroke Patients Only)       Balance Overall balance assessment: Needs assistance Sitting-balance support: No upper extremity supported Sitting balance-Leahy Scale: Good     Standing balance support: Bilateral upper extremity supported Standing balance-Leahy Scale: Poor Standing balance comment: walker and min assist for static standing                             Pertinent Vitals/Pain Pain Assessment: No/denies pain Pain Score: 0-No pain Pain Intervention(s): Monitored during session    Home Living Family/patient expects to be discharged to:: Skilled nursing facility               Home Equipment: Dan Humphreys - 2 wheels;Cane - single point;Shower seat;Grab bars - toilet;Grab bars - tub/shower Additional Comments: pt will d/c to [facility] first and then return to [facility]    Prior Function Level of Independence: Independent with assistive device(s)         Comments: Pt reports independence with ADL and mobility with SPC or RW at home. Family lives nearby to assist. Amb to dining room for meals.     Hand Dominance   Dominant Hand: Right    Extremity/Trunk Assessment   Upper Extremity Assessment Upper Extremity Assessment: Defer to OT evaluation    Lower Extremity Assessment Lower Extremity Assessment: LLE deficits/detail LLE Deficits / Details: Pt with 5/5 strength with manual muscle testing but weakness vs decr proprioception when used functionally. LLE Coordination: decreased gross motor    Cervical /  Trunk Assessment Cervical / Trunk Assessment: Kyphotic  Communication   Communication: HOH  Cognition Arousal/Alertness: Awake/alert Behavior During Therapy: WFL for tasks assessed/performed Overall Cognitive Status: Within Functional Limits for tasks assessed                                        General Comments General comments (skin  integrity, edema, etc.): VSS on RA    Exercises     Assessment/Plan    PT Assessment Patient needs continued PT services  PT Problem List Decreased strength;Decreased activity tolerance;Decreased balance;Decreased mobility;Impaired sensation       PT Treatment Interventions DME instruction;Gait training;Functional mobility training;Therapeutic activities;Therapeutic exercise;Balance training;Patient/family education    PT Goals (Current goals can be found in the Care Plan section)  Acute Rehab PT Goals Patient Stated Goal: to go to [facility] first then back to ALF PT Goal Formulation: With patient/family Time For Goal Achievement: 04/21/20 Potential to Achieve Goals: Good    Frequency Min 3X/week   Barriers to discharge        Co-evaluation               AM-PAC PT "6 Clicks" Mobility  Outcome Measure Help needed turning from your back to your side while in a flat bed without using bedrails?: A Little Help needed moving from lying on your back to sitting on the side of a flat bed without using bedrails?: A Little Help needed moving to and from a bed to a chair (including a wheelchair)?: A Little Help needed standing up from a chair using your arms (e.g., wheelchair or bedside chair)?: A Little Help needed to walk in hospital room?: A Lot Help needed climbing 3-5 steps with a railing? : Total 6 Click Score: 15    End of Session Equipment Utilized During Treatment: Gait belt Activity Tolerance: Patient tolerated treatment well Patient left: in chair;with call bell/phone within reach;with chair alarm set;with family/visitor present Nurse Communication: Mobility status PT Visit Diagnosis: Unsteadiness on feet (R26.81);Other abnormalities of gait and mobility (R26.89);Hemiplegia and hemiparesis Hemiplegia - Right/Left: Left Hemiplegia - dominant/non-dominant: Non-dominant Hemiplegia - caused by: Cerebral infarction    Time: 1250-1307 PT Time Calculation  (min) (ACUTE ONLY): 17 min   Charges:   PT Evaluation $PT Eval Moderate Complexity: 1 Mod          [facility] PT Acute Rehabilitation Services Pager [phone] Office [phone]   Angelina Ok [facility] 04/07/2020, 3:44 PM

## 2020-04-08 LAB — RESP PANEL BY RT-PCR (FLU A&B, COVID) ARPGX2
Influenza A by PCR: NEGATIVE
Influenza B by PCR: NEGATIVE
SARS Coronavirus 2 by RT PCR: NEGATIVE

## 2020-04-08 MED ORDER — CLOPIDOGREL BISULFATE 75 MG PO TABS: 75.0000 mg | ORAL_TABLET | Freq: Every day | ORAL | 0 refills | Status: AC

## 2020-04-08 MED ORDER — ATORVASTATIN CALCIUM 10 MG PO TABS: 20.0000 mg | ORAL_TABLET | Freq: Every day | ORAL | 1 refills | Status: AC

## 2020-04-08 MED ORDER — AMLODIPINE BESYLATE 10 MG PO TABS: 10.0000 mg | ORAL_TABLET | Freq: Every day | ORAL | 0 refills | Status: DC

## 2020-04-08 MED ORDER — AMLODIPINE BESYLATE 10 MG PO TABS
10.0000 mg | ORAL_TABLET | Freq: Every day | ORAL | Status: DC
  Administered 2020-04-08: 10 mg via ORAL
  Filled 2020-04-08: qty 1

## 2020-04-08 MED ORDER — AMLODIPINE BESYLATE 5 MG PO TABS: 5.0000 mg | ORAL_TABLET | Freq: Every day | ORAL | Status: DC

## 2020-04-08 NOTE — TOC Initial Note (Signed)
Transition of Care [facility]) - Initial/Assessment Note    Patient Details  Name: Shelley Sloan MRN: [id number] Date of Birth: 20-Feb-1923  [facility] CM/SW Contact:    Cristobal Goldmann, LCSW Phone Number: 04/08/2020, 11:30 AM  Clinical Narrative: CSW talked with daughter [place] [phone]) by phone. She was at the bedside with the patient. Daughter confirmed that her mother is from [facility] apartments and the plan is for her to discharge back to [facility] SNF before returning to her apartment. When asked, daughter responded that her mom has been at [facility] apartments for 3/4 years.    Talked with Leeanne Rio [phone]), SW with [facility] and patient will be going to SNF - room 32.                 Expected Discharge Plan: Skilled Nursing Facility ([facility] skilled nursing facility) Barriers to Discharge: Barriers Resolved   Patient Goals and CMS Choice Patient states their goals for this hospitalization and ongoing recovery are:: Per daughter patient will discharge to [facility] skilled nursing before returning to her [facility] at [facility].gov Compare Post Acute Care list provided to:: Other (Comment Required) (Not needed) Choice offered to / list presented to : NA (Patient lives [facility] and will d/c to [facility] facility)  Expected Discharge Plan and Services Expected Discharge Plan: Skilled Nursing Facility ([facility] skilled nursing facility) In-house Referral: Clinical Social Work   Post Acute Care Choice: Skilled Holiday representative ([facility] SN[facility]) Living arrangements for the past 2 months: Apartment ([facility] [address])                                     Prior Living Arrangements/Services Living arrangements for the past 2 months: Apartment ([facility] - [address]) Lives with:: Self Patient language and need for interpreter reviewed:: No Do you feel safe going  back to the place where you live?: No   Patient/family agreeable to patient going to [facility] SNF for ST rehab  Need for Family Participation in Patient Care: Yes (Comment) Care giver support system in place?: Yes (comment)   Criminal Activity/Legal Involvement Pertinent to Current Situation/Hospitalization: No - Comment as needed  Activities of Daily Living      Permission Sought/Granted Permission sought to share information with : Family Supports Permission granted to share information with :  (Daughter was in room with patient when CSW talked with her by phone)              Emotional Assessment Appearance:: Other (Comment Required (Did not visit room, talked with daughter by phone) Attitude/Demeanor/Rapport: Unable to Assess Affect (typically observed): Unable to Assess Orientation: : Oriented to Self,Oriented to Place,Oriented to Situation Alcohol / Substance Use: Tobacco Use,Alcohol Use,Illicit Drugs (Per H&P, patient quit smoking, drinks alcohol and does not use illicit drugs) Psych Involvement: No (comment)  Admission diagnosis:  Acute ischemic stroke (HCC) [I63.9] Acute CVA (cerebrovascular accident) [facility]) [I63.9] Patient Active Problem List   Diagnosis Date Noted  . Acute ischemic stroke (HCC) 04/06/2020   PCP:  Charlane Ferretti, DO Pharmacy:   [facility] [address], [place] - [address] [place] [address] Phone: [phone] Fax: [phone]    Social Determinants of Health (SDOH) Interventions  No SDOH interventions requested  or needed at discharge  Readmission Risk Interventions No flowsheet data found.

## 2020-04-08 NOTE — Progress Notes (Signed)
Report called to [facility] at [facility].

## 2020-04-08 NOTE — Discharge Summary (Signed)
Stroke Discharge Summary  Patient ID: Shelley Sloan   MRN: [id number]      DOB: [birth year]  Date of Admission: 04/06/2020 Date of Discharge: 04/08/2020  Attending Physician:  Marvel Plan, MD, Stroke MD Consultant(s):    None  Patient's PCP:  Charlane Ferretti, DO  DISCHARGE DIAGNOSIS:  Active Problems:   Stroke - Acute infarct of R thalamus / PLIC s/p tPA, likely small vessel disease  Secondary diagnosis  HTN  HLD  hypothyroidism  Allergies as of 04/08/2020      Reactions   Sulfa Antibiotics Hives      Medication List    TAKE these medications   amLODipine 10 MG tablet Commonly known as: NORVASC Take 1 tablet (10 mg total) by mouth daily. What changed:   medication strength  how much to take  when to take this   aspirin EC 81 MG tablet Take 81 mg by mouth daily. Swallow whole.   atorvastatin 10 MG tablet Commonly known as: LIPITOR Take 2 tablets (20 mg total) by mouth daily. Start taking on: April 09, 2020   cholecalciferol 25 MCG (1000 UNIT) tablet Commonly known as: VITAMIN D3 Take 1,000 Units by mouth daily.   clopidogrel 75 MG tablet Commonly known as: PLAVIX Take 1 tablet (75 mg total) by mouth daily. Start taking on: April 09, 2020   ICAPS AREDS 2 PO Take 1 capsule by mouth 2 (two) times daily.   levothyroxine 50 MCG tablet Commonly known as: SYNTHROID Take 50 mcg by mouth daily.   meclizine 12.5 MG tablet Commonly known as: ANTIVERT Take 1 tablet (12.5 mg total) by mouth 3 (three) times daily as needed for dizziness.       LABORATORY STUDIES CBC    Component Value Date/Time   WBC 5.7 04/06/2020 1150   RBC 4.36 04/06/2020 1150   HGB 12.9 04/06/2020 1159   HCT 38.0 04/06/2020 1159   PLT 242 04/06/2020 1150   MCV 94.5 04/06/2020 1150   MCH 30.0 04/06/2020 1150   MCHC 31.8 04/06/2020 1150   RDW 14.2 04/06/2020 1150   LYMPHSABS 1.6 04/06/2020 1150   MONOABS 0.5 04/06/2020 1150   EOSABS 0.3 04/06/2020 1150   BASOSABS 0.0 04/06/2020  1150   CMP    Component Value Date/Time   NA 140 04/06/2020 1159   K 4.0 04/06/2020 1159   CL 107 04/06/2020 1159   CO2 25 04/06/2020 1150   GLUCOSE 102 (H) 04/06/2020 1159   BUN 28 (H) 04/06/2020 1159   CREATININE 0.60 04/06/2020 1159   CALCIUM 9.0 04/06/2020 1150   PROT 6.6 04/06/2020 1150   ALBUMIN 3.5 04/06/2020 1150   AST 29 04/06/2020 1150   ALT 9 04/06/2020 1150   ALKPHOS 78 04/06/2020 1150   BILITOT 1.0 04/06/2020 1150   GFRNONAA >60 04/06/2020 1150   COAGS Lab Results  Component Value Date   INR 1.0 04/06/2020   Lipid Panel    Component Value Date/Time   CHOL 170 04/07/2020 0150   TRIG 80 04/07/2020 0150   HDL 58 04/07/2020 0150   CHOLHDL 2.9 04/07/2020 0150   VLDL 16 04/07/2020 0150   LDLCALC 96 04/07/2020 0150   HgbA1C  Lab Results  Component Value Date   HGBA1C 5.8 (H) 04/07/2020   Urinalysis No results found for: COLORURINE, APPEARANCEUR, LABSPEC, PHURINE, GLUCOSEU, HGBUR, BILIRUBINUR, KETONESUR, PROTEINUR, UROBILINOGEN, NITRITE, LEUKOCYTESUR Urine Drug Screen No results found for: LABOPIA, COCAINSCRNUR, LABBENZ, AMPHETMU, THCU, LABBARB  Alcohol Level No results found  for: [facility]   SIGNIFICANT DIAGNOSTIC STUDIES MR ANGIO HEAD WO CONTRAST  Result Date: 04/07/2020 CLINICAL DATA:  Acute onset of left-sided weakness. EXAM: MRI HEAD WITHOUT CONTRAST MRA HEAD WITHOUT CONTRAST TECHNIQUE: Multiplanar, multiecho pulse sequences of the brain and surrounding structures were obtained without intravenous contrast. Angiographic images of the head were obtained using MRA technique without contrast. COMPARISON:  Head CT yesterday. FINDINGS: MRI HEAD FINDINGS Brain: Small acute infarction at the right lateral thalamus/posterior limb internal capsule. No evidence of hemorrhage or mass effect. No other acute finding. Chronic small-vessel ischemic changes affect the pons. Few old small vessel cerebellar infarctions. Cerebral hemispheres show generalized atrophy with moderate  to marked chronic small-vessel ischemic changes of the white matter. No cortical or large vessel territory infarction. No mass, hemorrhage, hydrocephalus or extra-axial collection. Vascular: Major vessels at the base of the brain show flow. Skull and upper cervical spine: Negative Sinuses/Orbits: Clear/normal Other: None MRA HEAD FINDINGS Both internal carotid arteries are widely patent into the brain. No siphon stenosis. The anterior and middle cerebral vessels are patent without proximal stenosis, aneurysm or vascular malformation. Both vertebral arteries are widely patent to the basilar. No basilar stenosis. Posterior circulation branch vessels appear normal. IMPRESSION: 1. Small acute infarction at the right lateral thalamus/posterior limb internal capsule. No hemorrhage or mass effect. 2. Chronic small-vessel ischemic changes elsewhere throughout the brain as outlined above. Generalized brain atrophy. 3. Normal intracranial MR angiography of the large and medium size vessels. Electronically Signed   By: Paulina Fusi M.D.   On: 04/07/2020 11:34   MR BRAIN WO CONTRAST  Result Date: 04/07/2020 CLINICAL DATA:  Acute onset of left-sided weakness. EXAM: MRI HEAD WITHOUT CONTRAST MRA HEAD WITHOUT CONTRAST TECHNIQUE: Multiplanar, multiecho pulse sequences of the brain and surrounding structures were obtained without intravenous contrast. Angiographic images of the head were obtained using MRA technique without contrast. COMPARISON:  Head CT yesterday. FINDINGS: MRI HEAD FINDINGS Brain: Small acute infarction at the right lateral thalamus/posterior limb internal capsule. No evidence of hemorrhage or mass effect. No other acute finding. Chronic small-vessel ischemic changes affect the pons. Few old small vessel cerebellar infarctions. Cerebral hemispheres show generalized atrophy with moderate to marked chronic small-vessel ischemic changes of the white matter. No cortical or large vessel territory infarction. No  mass, hemorrhage, hydrocephalus or extra-axial collection. Vascular: Major vessels at the base of the brain show flow. Skull and upper cervical spine: Negative Sinuses/Orbits: Clear/normal Other: None MRA HEAD FINDINGS Both internal carotid arteries are widely patent into the brain. No siphon stenosis. The anterior and middle cerebral vessels are patent without proximal stenosis, aneurysm or vascular malformation. Both vertebral arteries are widely patent to the basilar. No basilar stenosis. Posterior circulation branch vessels appear normal. IMPRESSION: 1. Small acute infarction at the right lateral thalamus/posterior limb internal capsule. No hemorrhage or mass effect. 2. Chronic small-vessel ischemic changes elsewhere throughout the brain as outlined above. Generalized brain atrophy. 3. Normal intracranial MR angiography of the large and medium size vessels. Electronically Signed   By: Paulina Fusi M.D.   On: 04/07/2020 11:34   ECHOCARDIOGRAM COMPLETE  Result Date: 04/06/2020    ECHOCARDIOGRAM REPORT   Patient Name:   KORIE STREAT Date of Exam: 04/06/2020 Medical Rec #:  [id number]        Height:       63.0 in Accession #:    [id number]       Weight:       126.5 lb Date of Birth:  1923/02/06        BSA:          1.592 m Patient Age:    89 years         BP:           156/54 mmHg Patient Gender: F                HR:           63 bpm. Exam Location:  Inpatient Procedure: 2D Echo Indications:    Stroke I63.9  History:        Patient has no prior history of Echocardiogram examinations.                 Risk Factors:Hypertension.  Sonographer:    Thurman Coyerasey Kirkpatrick RDCS (AE) Referring Phys: [id number] ASHISH ARORA IMPRESSIONS  1. Left ventricular ejection fraction, by estimation, is 60 to 65%. The left ventricle has normal function. The left ventricle has no regional wall motion abnormalities. There is mild asymmetric left ventricular hypertrophy of the basal and septal segments. Left ventricular diastolic parameters  were normal.  2. Right ventricular systolic function is normal. The right ventricular size is normal. There is normal pulmonary artery systolic pressure.  3. The mitral valve is normal in structure. No evidence of mitral valve regurgitation. No evidence of mitral stenosis.  4. The aortic valve is tricuspid. There is mild calcification of the aortic valve. Aortic valve regurgitation is not visualized. Mild aortic valve sclerosis is present, with no evidence of aortic valve stenosis.  5. The inferior vena cava is normal in size with greater than 50% respiratory variability, suggesting right atrial pressure of 3 mmHg. FINDINGS  Left Ventricle: Left ventricular ejection fraction, by estimation, is 60 to 65%. The left ventricle has normal function. The left ventricle has no regional wall motion abnormalities. The left ventricular internal cavity size was normal in size. There is  mild asymmetric left ventricular hypertrophy of the basal and septal segments. Left ventricular diastolic parameters were normal. Right Ventricle: The right ventricular size is normal. No increase in right ventricular wall thickness. Right ventricular systolic function is normal. There is normal pulmonary artery systolic pressure. The tricuspid regurgitant velocity is 2.16 m/s, and  with an assumed right atrial pressure of 3 mmHg, the estimated right ventricular systolic pressure is 21.7 mmHg. Left Atrium: Left atrial size was normal in size. Right Atrium: Right atrial size was normal in size. Pericardium: There is no evidence of pericardial effusion. Mitral Valve: The mitral valve is normal in structure. No evidence of mitral valve regurgitation. No evidence of mitral valve stenosis. Tricuspid Valve: The tricuspid valve is normal in structure. Tricuspid valve regurgitation is mild . No evidence of tricuspid stenosis. Aortic Valve: The aortic valve is tricuspid. There is mild calcification of the aortic valve. Aortic valve regurgitation is not  visualized. Aortic regurgitation PHT measures 365 msec. Mild aortic valve sclerosis is present, with no evidence of aortic valve stenosis. Pulmonic Valve: The pulmonic valve was normal in structure. Pulmonic valve regurgitation is not visualized. No evidence of pulmonic stenosis. Aorta: The aortic root is normal in size and structure. Venous: The inferior vena cava is normal in size with greater than 50% respiratory variability, suggesting right atrial pressure of 3 mmHg. IAS/Shunts: No atrial level shunt detected by color flow Doppler.  LEFT VENTRICLE PLAX 2D LVIDd:         3.60 cm  Diastology LVIDs:         2.20 cm  LV e' medial:    7.51 cm/s LV PW:         1.10 cm  LV E/e' medial:  12.1 LV IVS:        1.20 cm  LV e' lateral:   7.62 cm/s LVOT diam:     2.10 cm  LV E/e' lateral: 11.9 LV SV:         80 LV SV Index:   50 LVOT Area:     3.46 cm  RIGHT VENTRICLE RV S prime:     12.40 cm/s TAPSE (M-mode): 2.2 cm LEFT ATRIUM             Index       RIGHT ATRIUM           Index LA diam:        3.20 cm 2.01 cm/m  RA Area:     15.20 cm LA Vol (A2C):   27.5 ml 17.28 ml/m RA Volume:   36.90 ml  23.18 ml/m LA Vol (A4C):   32.4 ml 20.35 ml/m LA Biplane Vol: 30.5 ml 19.16 ml/m  AORTIC VALVE LVOT Vmax:   85.80 cm/s LVOT Vmean:  58.200 cm/s LVOT VTI:    0.232 m AI PHT:      365 msec  AORTA Ao Root diam: 3.20 cm MITRAL VALVE                TRICUSPID VALVE MV Area (PHT): 2.66 cm     TR Peak grad:   18.7 mmHg MV Decel Time: 285 msec     TR Vmax:        216.00 cm/s MV E velocity: 90.80 cm/s MV A velocity: 113.00 cm/s  SHUNTS MV E/A ratio:  0.80         Systemic VTI:  0.23 m                             Systemic Diam: 2.10 cm Charlton Haws MD Electronically signed by Charlton Haws MD Signature Date/Time: 04/06/2020/2:05:07 PM    Final    CT HEAD CODE STROKE WO CONTRAST  Result Date: 04/06/2020 CLINICAL DATA:  Code stroke. EXAM: CT HEAD WITHOUT CONTRAST TECHNIQUE: Contiguous axial images were obtained from the base of the skull  through the vertex without intravenous contrast. COMPARISON:  None. FINDINGS: Brain: There is no acute intracranial hemorrhage. Confluent areas of hypoattenuation in the supratentorial greater than pontine white matter are nonspecific probably reflect moderate to advanced chronic microvascular ischemic changes. Prominence of the ventricles and sulci reflects generalized parenchymal volume loss. Gray-white differentiation is preserved. There is no extra-axial fluid collection. Vascular: No hyperdense vessel. There is intracranial atherosclerotic calcification at the skull base. Skull: Unremarkable. Sinuses/Orbits: Minor mucosal thickening. No significant orbital abnormality. Other: Mastoid air cells are clear. ASPECTS ([place]ber[place] Stroke Program Early CT Score) - Ganglionic level infarction (caudate, lentiform nuclei, internal capsule, insula, M1-M3 cortex): 7 - Supraganglionic infarction (M4-M6 cortex): 3 Total score (0-10 with 10 being normal): 10 IMPRESSION: There is no acute intracranial hemorrhage or evidence of acute infarction. ASPECT score is 10. Moderate to advanced chronic microvascular ischemic changes. These results were communicated to Dr. Wilford Corner at 12:04 pm on 04/06/2020 by text page via the [facility] messaging system. Electronically Signed   By: Guadlupe Spanish M.D.   On: 04/06/2020 12:09   VAS US CAROTID  Result Date: 04/08/2020 Carotid Arterial Duplex Study Indications:       Weakness and Left  side hemiparesis (arm & leg). Risk Factors:      Hypertension, past history of smoking. Comparison Study:  No previous exams Performing Technologist: Ernestene Mention  Examination Guidelines: A complete evaluation includes B-mode imaging, spectral Doppler, color Doppler, and power Doppler as needed of all accessible portions of each vessel. Bilateral testing is considered an integral part of a complete examination. Limited examinations for reoccurring indications may be performed as noted.  Right Carotid Findings:  +----------+--------+--------+--------+------------------+------------------+           PSV cm/sEDV cm/sStenosisPlaque DescriptionComments           +----------+--------+--------+--------+------------------+------------------+ CCA Prox  59      13                                intimal thickening +----------+--------+--------+--------+------------------+------------------+ CCA Distal47      5                                                    +----------+--------+--------+--------+------------------+------------------+ ICA Prox  61      12                                                   +----------+--------+--------+--------+------------------+------------------+ ICA Distal61      15                                                   +----------+--------+--------+--------+------------------+------------------+ ECA       87                                                           +----------+--------+--------+--------+------------------+------------------+ +----------+--------+-------+----------------+-------------------+           PSV cm/sEDV cmsDescribe        Arm Pressure (mmHG) +----------+--------+-------+----------------+-------------------+ [id number]            Multiphasic, WNL                    +----------+--------+-------+----------------+-------------------+ +---------+--------+--+--------+-+---------+ VertebralPSV cm/s49EDV cm/s7Antegrade +---------+--------+--+--------+-+---------+  Left Carotid Findings: +----------+--------+-------+--------+----------------------+------------------+           PSV cm/sEDV    StenosisPlaque Description    Comments                             cm/s                                                    +----------+--------+-------+--------+----------------------+------------------+ CCA Prox  63      10  intimal thickening  +----------+--------+-------+--------+----------------------+------------------+ CCA Distal55      11             smooth and            intimal thickening                                  heterogenous                             +----------+--------+-------+--------+----------------------+------------------+ ICA Prox  65      12                                                      +----------+--------+-------+--------+----------------------+------------------+ ICA Distal71      16                                                      +----------+--------+-------+--------+----------------------+------------------+ ECA       65                                                              +----------+--------+-------+--------+----------------------+------------------+ +----------+--------+--------+----------------+-------------------+           PSV cm/sEDV cm/sDescribe        Arm Pressure (mmHG) +----------+--------+--------+----------------+-------------------+ [id number]             Multiphasic, WNL                    +----------+--------+--------+----------------+-------------------+ +---------+--------+--+--------+--+---------+ VertebralPSV cm/s50EDV cm/s10Antegrade +---------+--------+--+--------+--+---------+   Summary: Right Carotid: The extracranial vessels were near-normal with only minimal wall                thickening or plaque. Left Carotid: The extracranial vessels were near-normal with only minimal wall               thickening or plaque. Vertebrals:  Bilateral vertebral arteries demonstrate antegrade flow. Subclavians: Normal flow hemodynamics were seen in bilateral subclavian              arteries. *See table(s) above for measurements and observations.  Electronically signed by Gretta Began MD on 04/08/2020 at 6:05:42 AM.    Final       HISTORY OF PRESENT ILLNESS Shelley Sloan a 87 y.o.femalepast medical history of hypertension, arthritis,  living at an assisted living facility, last known well at 9 AM when she woke up and went to the bathroom without any problems, went back to take a nap and at around 10 AM noticed that her left side was weaker. She noticed weakness in the arm and leg on both sides. The weakness did not subside and she also felt some subjective sense of heaviness and numbness on that side. EMS was called. On their evaluation, she had some left-sided arm and leg drift. Code stroke was activated and she was brought in for emergent stroke evaluation. On the  bridge, her NIH stroke scale was 3. Noncontrast head CT without evidence of bleed. S/p tPA 12:07 pm on  4/4. NIHSS was 1 post tPA.  HOSPITAL COURSE Patient CT was negative for intracranial abnormality; however as patient NIHSS was >0 patient received tPA. Patient underwent MRI Brain and an acute ischemic infarct of the R lateral thalamus/posterior limb internal capsule was detected. Patient was started on ASA+ Plavix 24 hours after tPA administration. Patient was also started on statin therapy. Patient physical exam improved during her stay as patient did exhibit some mild weakness and dsymetria of the left side but this decreased gradually on subsequent exams. Patient was seen by PT/OT who recommended patient continue therapy at the SNF facility located on her residence.  Patient was noted to have increase in her BP throughout stay, but was asymptomatic. Patient amlodipine was increased from 5mg  to 10mg  and patient was recommended to follow up with her PCP OP for further management.   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   MRI Small acute infarction at the right lateral thalamus/posterior limb internal capsule. No hemorrhage or mass effect.  MRA head normal intracranial MR angiography of the large and medium size vessels.  CUS unremarkable  2D echo EF 60 to 65%  LDL 96  HgbA1c  5.8  VTE prophylaxis -Lovenox  aspirin 81 mg daily prior to admission, now on ASA + Plavix for 3 weeks and the Plavix alone  Therapy recommendations:  pending  Disposition:  Home  Hypertension  Home meds:  amlodipine 5mg    BP on the high end  Will start amlodipine 10mg  daily  Long-term BP goal normotensive  Hyperlipidemia  Home meds - none  LDL 96, goal < 70  On atorvastatin 20mg   Continue statin on discharge  Other Stroke Risk Factors  Advanced Age >/= 45   Other active issue   Hypothyroid - Continue home synthroid daily   DISCHARGE EXAM Blood pressure (!) 183/77, pulse 72, temperature 97.9 F (36.6 C), temperature source Oral, resp. rate 11, height 5\' 3"  (1.6 m), weight 57.4 kg, SpO2 97 %.  GENERAL: Awake, alert in NAD HEENT: - Normocephalic and atraumatic, dry mm LUNGS - Chest movement is symmetrical and patient appears to be breathing well with no signs of distress CV - RRR Ext: warm, well perfused, intact peripheral pulses,no edema  NEURO:  Mental Status: Alert to person, place, time, and situation. Patient is pleasant and able to converse about her care. Language: speech isclear and not dysarthric. Naming, repetition, fluency, and comprehension intact.  Cranial Nerves: PERRL, EOMI, visual fields full, no facial asymmetry, facial sensation intact, hearingreduced bilaterally (wears hearing aids at baseline, but does not have them at the moment), tongue/uvula/soft palate midline, normal sternocleidomastoid and trapezius muscle strength. No evidence of tongue atrophy or fibrillations Motor:5 /5 in BUE and BLE.  Tone: is normal and bulk is normal Sensation-intact in all extremities. Facial sensation preserved bilaterally Coordination: FTN notes to have very mild dysmetria in the Left hand. Gait- deferred  Discharge Diet       Diet   Diet heart healthy/carb modified Room service appropriate? Yes; Fluid consistency: Thin    liquids  DISCHARGE PLAN  Disposition:  SNF/Home to [facility]  aspirin 81 mg daily and clopidogrel 75 mg daily for secondary stroke prevention for 3 weeks then Plavix alone.  Discharged meds: Amlodipine 10mg  daily and Atorvastatin 20mg   Ongoing  stroke risk factor control by Primary Care Physician at time of discharge  Spoke with daughter and one of the sons over phone and explained patient diagnosis and medication regimen and suggested followup.  Follow-up PCP Charlane Ferretti, DO in 2 weeks.  Follow-up in [facility] in 4 weeks, office to schedule an appointment.   35  minutes were spent preparing discharge.  Marvel Plan, MD PhD Stroke Neurology 04/08/2020 1:58 PM

## 2020-04-08 NOTE — Evaluation (Signed)
Speech Language Pathology Evaluation Patient Details Name: Shelley Sloan MRN: [id number] DOB: September 23, 1923 Today's Date: 04/08/2020 Time: 7741-2878 SLP Time Calculation (min) (ACUTE ONLY): 15.05 min  Problem List:  Patient Active Problem List   Diagnosis Date Noted  . Acute ischemic stroke (HCC) 04/06/2020   Past Medical History:  Past Medical History:  Diagnosis Date  . Arthritis   . Hypertension    Past Surgical History:  Past Surgical History:  Procedure Laterality Date  . ABDOMINAL HYSTERECTOMY    . JOINT REPLACEMENT Right 1995  . JOINT REPLACEMENT Left 1997   HPI:  Pt is a 87 yo female with PMHx HTN, arthritis who was admitted 4/4 with L sided weakness from ALF. MRI brain 4/5: Small acute infarction at the right lateral thalamus/posterior limb internal capsule. TPA administered at 12pm 4/4.   Assessment / Plan / Recommendation Clinical Impression  Pt participated in speech/language evaluation with her daughter present. Both parties denied any acute deficits in speech or language. Pt reported that her cognition is "what would be expected for someone my age", but denied any significant impairments. Her speech and language skills are currently within normal limits and no overt cognitive-linguistic deficits were noted. Cueing was needed during a delayed recall task, but this was [facility] and pt reported this to be her baseline. Further skilled SLP services are not clinically indicated at this time. Pt, [name], RN, and her daughter were educated regarding results and recommendations; all parties verbalized understanding as well as agreement with plan of care.    SLP Assessment  SLP Recommendation/Assessment: Patient does not need any further Speech Lanaguage Pathology Services SLP Visit Diagnosis: Cognitive communication deficit (R41.841)    Follow Up Recommendations  None    Frequency and Duration           SLP Evaluation Cognition  Overall Cognitive Status: Within Functional  Limits for tasks assessed Arousal/Alertness: Awake/alert Orientation Level: Oriented X4 Attention: Focused;Sustained Focused Attention: Appears intact Sustained Attention: Appears intact Memory: Impaired Memory Impairment: Storage deficit;Retrieval deficit;Decreased recall of new information (Immediate: 3/3; delayed: 2/3; with cues: 1/1) Awareness: Appears intact Problem Solving: Appears intact Executive Function: Reasoning Reasoning: Appears intact       Comprehension  Auditory Comprehension Overall Auditory Comprehension: Appears within functional limits for tasks assessed Yes/No Questions: Within Functional Limits Basic Immediate Environment Questions:  (5/5) Complex Questions:  (5/5) Paragraph Comprehension (via yes/no questions):  (3/4) Commands: Within Functional Limits Two Step Basic Commands:  (4/4) Multistep Basic Commands:  (3/3) Conversation: Complex    Expression Expression Primary Mode of Expression: Verbal Verbal Expression Overall Verbal Expression: Appears within functional limits for tasks assessed Initiation: No impairment Automatic Speech: Counting;Day of week;Month of year (WNL) Level of Generative/Spontaneous Verbalization: Conversation Repetition: No impairment (5/5) Naming: No impairment Responsive:  (5/5) Confrontation: Within functional limits (5/5) Pragmatics: No impairment   Oral / Motor  Oral Motor/Sensory Function Overall Oral Motor/Sensory Function: Within functional limits Motor Speech Overall Motor Speech: Appears within functional limits for tasks assessed Respiration: Within functional limits Phonation: Normal Resonance: Within functional limits Articulation: Within functional limitis Intelligibility: Intelligible Motor Speech Errors: Not applicable   Yashar Inclan I. Vear Clock, MS, CCC-SLP Acute Rehabilitation Services Office number [phone] Pager [phone]                    Scheryl Marten 04/08/2020, 12:54  PM

## 2020-04-08 NOTE — NC FL2 (Addendum)
[place] MEDICAID FL2 LEVEL OF CARE SCREENING TOOL     IDENTIFICATION  Patient Name: Shelley Sloan Birthdate: 1923-05-17 Sex: female Admission Date (Current Location): 04/06/2020  [facility] and [place] Number:  Producer, television/film/video and Address:  [facility], [address], [place], [place] [address]      Provider Number: [id number]  Attending Physician Name and Address:  Marvel Plan, MD  Relative Name and Phone Number:  Rindy Kollman - daughter - [phone]    Current Level of Care: Other (Comment) (Independent Living at [facility]) Recommended Level of Care: Skilled Nursing Facility Prior Approval Number:    Date Approved/Denied:   PASRR Number: [id number] (Eff. 04/08/20.)  Discharge Plan: SNF    Current Diagnoses: Patient Active Problem List   Diagnosis Date Noted  . Acute ischemic stroke (HCC) 04/06/2020    Orientation RESPIRATION BLADDER Height & Weight     Self,Situation,Place  Normal Continent - External catheter - PureWick at hospital. Will not discharge with PureWick. Weight: 126 lb 8.7 oz (57.4 kg) Height:  5\' 3"  (160 cm)  BEHAVIORAL SYMPTOMS/MOOD NEUROLOGICAL BOWEL NUTRITION STATUS      Continent Diet (Heart healthy)  AMBULATORY STATUS COMMUNICATION OF NEEDS Skin   Limited Assist (Min assist per PT) Verbally Normal                       Personal Care Assistance Level of Assistance  Bathing,Feeding,Dressing Bathing Assistance: Limited assistance (Upper body assist with set-up and lower body min assist) Feeding assistance: Limited assistance (assistance with set-up) Dressing Assistance: Maximum assistance (Upper body assist with set-up and lower body mod assist)     Functional Limitations Info  Sight,Hearing,Speech Sight Info: Impaired (Wears glasses) Hearing Info: Impaired Speech Info: Adequate    SPECIAL CARE FACTORS FREQUENCY  PT (By licensed PT),OT (By licensed OT)     PT Frequency: Evaluated 4/5. PT at  SNF eval and treat, a minimum of 5 days per week OT Frequency: Evaluated 4/5. OT at SNF eval and treat, a minimum of 5 days per week            Contractures Contractures Info: Not present    Additional Factors Info  Code Status,Allergies Code Status Info: Full Allergies Info: Sulfur antibiotics           Current Medications (04/08/2020):  This is the current hospital active medication list Current Facility-Administered Medications  Medication Dose Route Frequency Provider Last Rate Last Admin  .  stroke: mapping our early stages of recovery book   Does not apply Once 06/08/2020, MD      . acetaminophen (TYLENOL) tablet 650 mg  650 mg Oral Q4H PRN Milon Dikes, MD   650 mg at 04/08/20 06/08/20   Or  . acetaminophen (TYLENOL) 160 MG/5ML solution 650 mg  650 mg Per Tube Q4H PRN [id number], MD       Or  . acetaminophen (TYLENOL) suppository 650 mg  650 mg Rectal Q4H PRN Milon Dikes, MD      . amLODipine (NORVASC) tablet 10 mg  10 mg Oral Daily Milon Dikes, MD      . aspirin EC tablet 81 mg  81 mg Oral Daily Marvel Plan, MD   81 mg at 04/08/20 0849  . atorvastatin (LIPITOR) tablet 20 mg  20 mg Oral Daily 06/08/20, MD   20 mg at 04/08/20 0849  . Chlorhexidine Gluconate Cloth 2 % PADS 6 each  6 each Topical Daily Milon Dikes, MD   6 each at 04/08/20 1039  . cholecalciferol (VITAMIN D3) tablet 1,000 Units  1,000 Units Oral Daily Marvel Plan, MD   1,000 Units at 04/08/20 0850  . clopidogrel (PLAVIX) tablet 75 mg  75 mg Oral Daily Marvel Plan, MD   75 mg at 04/08/20 0850  . enoxaparin (LOVENOX) injection 30 mg  30 mg Subcutaneous Q24H Marvel Plan, MD   30 mg at 04/07/20 1533  . levothyroxine (SYNTHROID) tablet 50 mcg  50 mcg Oral Q0600 Marvel Plan, MD   50 mcg at 04/08/20 0557  . pantoprazole (PROTONIX) EC tablet 40 mg  40 mg Oral Daily Marvel Plan, MD   40 mg at 04/08/20 0849  . senna-docusate (Senokot-S) tablet 1 tablet  1 tablet Oral QHS PRN Milon Dikes, MD      . sodium  chloride flush (NS) 0.9 % injection 3 mL  3 mL Intravenous Once Terrilee Files, MD         Discharge Medications: Please see discharge summary for a list of discharge medications.  Relevant Imaging Results:  Relevant Lab Results:   Additional Information ss#[id number].  Patient has had 2 does of pfizer/moderna vaccine and 1 does of J&J  Kritika Stukes, Lazaro Arms, LCSW

## 2020-04-08 NOTE — Plan of Care (Signed)
  Problem: Education: Goal: Knowledge of General Education information will improve Description Including pain rating scale, medication(s)/side effects and non-pharmacologic comfort measures Outcome: Adequate for Discharge   Problem: Health Behavior/Discharge Planning: Goal: Ability to manage health-related needs will improve Outcome: Adequate for Discharge   

## 2020-04-08 NOTE — Research (Signed)
Patient is eligible for the OPTIMISTmain trial - she received tPA and had low-intensity monitoring while in the hospital. Research study explained and information sheet left at the bedside with daughter and patient.   Patient agreed to have data collected, answer discharge questions, and have 90-day follow-up questions completed. Given chance to Opt-out and agreed to be a part of the study.

## 2020-04-09 ENCOUNTER — Non-Acute Institutional Stay (SKILLED_NURSING_FACILITY): Payer: Medicare Other | Admitting: Internal Medicine

## 2020-04-09 ENCOUNTER — Encounter: Payer: Self-pay | Admitting: Internal Medicine

## 2020-04-09 DIAGNOSIS — I10 Essential (primary) hypertension: Secondary | ICD-10-CM

## 2020-04-09 DIAGNOSIS — R42 Dizziness and giddiness: Secondary | ICD-10-CM

## 2020-04-09 DIAGNOSIS — E039 Hypothyroidism, unspecified: Secondary | ICD-10-CM

## 2020-04-09 DIAGNOSIS — E785 Hyperlipidemia, unspecified: Secondary | ICD-10-CM

## 2020-04-09 DIAGNOSIS — I639 Cerebral infarction, unspecified: Secondary | ICD-10-CM | POA: Diagnosis not present

## 2020-04-09 NOTE — Progress Notes (Signed)
Pt. Leaving facility via [facility] transport. All belongings with pt. Vital signs obtained prior to transport. Rn printed off documents and discharge paperwork and reviewed with pt. And transport reviewed as well. Rn removed IV and Purwick. Facilty aware and pt had no questions.

## 2020-04-09 NOTE — Progress Notes (Signed)
Provider:  Einar Crow MD Location:    [facility] Room Number: 32 Place of Service:  SNF (31)  PCP: Charlane Ferretti, DO Patient Care Team: Charlane Ferretti, DO as PCP - General (Internal Medicine)  Extended Emergency Contact Information Primary Emergency Contact: Elease Etienne [place] Mobile Phone: [phone] Relation: Son Secondary Emergency Contact: Boomhower,Jeffrey Mobile Phone: [phone] Relation: Son  Code Status: Full Code Goals of Care: Advanced Directive information Advanced Directives 04/09/2020  Does Patient Have a Medical Advance Directive? Yes  Type of Advance Directive Living will;Healthcare Power of Attorney  Does patient want to make changes to medical advance directive? No - Patient declined  Copy of Healthcare Power of Attorney in Chart? No - copy requested      Chief Complaint  Patient presents with  . New Admit To SNF    Admission to SNF    HPI: Patient is a 87 y.o. 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 Patient has h/o HTN, HLD,Arthiritis, Vertigo  [place] And per her son was very independent in her ADLS. Walked with her walker She noticed weakness in her Left arm and leg in the morning called her son and then facility nurse Was send to the ED CT scan Negative for any bleed Given TPA.  Her MRI was positive for ischemic infarct of the right leg thalamus and posterior internal capsule.  She was started on aspirin and Plavix. Patient's amlodipine was increased to 10 mg as her blood pressure was running high in the hospital. This morning patient had an episode of dizziness.  She states she felt lightheaded when she was sitting on the commode with the therapist.  Also felt felt nauseated but did not throw up. Her systolic blood pressure was 100 at that time. Otherwise she had no  complaints.  Denies any reflux-like symptoms abdominal pain.  Past Medical History:  Diagnosis Date  . Arthritis   . Hypertension    Past Surgical History:  Procedure Laterality Date  . ABDOMINAL HYSTERECTOMY    . JOINT REPLACEMENT Right 1995  . JOINT REPLACEMENT Left 1997    reports that she quit smoking about 47 years ago. She has a 9.00 pack-year smoking history. She has never used smokeless tobacco. She reports current alcohol use. She reports that she does not use drugs. Social History   Socioeconomic History  . Marital status: Married    Spouse name: Not on file  . Number of children: Not on file  . Years of education: Not on file  . Highest education level: Not on file  Occupational History  . Not on file  Tobacco Use  . Smoking status: Former Smoker    Packs/day: 0.50    Years: 18.00    Pack years: 9.00    Quit date: 1975    Years since quitting: 47.2  . Smokeless tobacco: Never Used  Substance and Sexual Activity  . Alcohol use: Yes  . Drug use: Never  . Sexual activity: Not on file  Other Topics Concern  . Not on file  Social History Narrative  . Not on file   Social Determinants of Health   Financial Resource Strain: Not on file  Food Insecurity: Not on file  Transportation Needs: Not on file  Physical Activity: Not on file  Stress: Not on file  Social  Connections: Not on file  Intimate Partner Violence: Not on file    Functional Status Survey:    History reviewed. No pertinent family history.  Health Maintenance  Topic Date Due  . COVID-19 Vaccine (1) Never done  . TETANUS/TDAP  Never done  . DEXA SCAN  Never done  . PNA vac Low Risk Adult (1 of 2 - PCV13) Never done  . INFLUENZA VACCINE  08/03/2020  . HPV VACCINES  Aged Out    Allergies  Allergen Reactions  . Sulfa Antibiotics Hives    Allergies as of 04/09/2020      Reactions   Sulfa Antibiotics Hives      Medication List       Accurate as of April 09, 2020 10:54 AM. If you  have any questions, ask your nurse or doctor.        amLODipine 10 MG tablet Commonly known as: NORVASC Take 1 tablet (10 mg total) by mouth daily.   aspirin EC 81 MG tablet Take 81 mg by mouth daily. Swallow whole.   atorvastatin 10 MG tablet Commonly known as: LIPITOR Take 2 tablets (20 mg total) by mouth daily.   cholecalciferol 25 MCG (1000 UNIT) tablet Commonly known as: VITAMIN D3 Take 1,000 Units by mouth daily.   clopidogrel 75 MG tablet Commonly known as: PLAVIX Take 1 tablet (75 mg total) by mouth daily.   ICAPS AREDS 2 PO Take 1 capsule by mouth 2 (two) times daily.   levothyroxine 50 MCG tablet Commonly known as: SYNTHROID Take 50 mcg by mouth daily.   meclizine 12.5 MG tablet Commonly known as: ANTIVERT Take 1 tablet (12.5 mg total) by mouth 3 (three) times daily as needed for dizziness.   zinc oxide 20 % ointment Apply 1 application topically as needed for irritation.       Review of Systems  Review of Systems  Constitutional: Negative for activity change, appetite change, chills, diaphoresis, fatigue and fever.  HENT: Negative for mouth sores, postnasal drip, rhinorrhea, sinus pain and sore throat.   Respiratory: Negative for apnea, cough, chest tightness, shortness of breath and wheezing.   Cardiovascular: Negative for chest pain, palpitations and leg swelling.  Gastrointestinal: Negative for abdominal distention, abdominal pain, constipation, diarrhea, nausea and vomiting.  Genitourinary: Negative for dysuria and frequency.  Musculoskeletal: Negative for arthralgias, joint swelling and myalgias.  Skin: Negative for rash.  Neurological: positive for dizziness, , weakness, light-headedness   Psychiatric/Behavioral: Negative for behavioral problems, confusion and sleep disturbance.     Vitals:   04/09/20 1028  BP: (!) 156/78  Pulse: 72  Resp: 16  Temp: 97.8 F (36.6 C)  SpO2: 97%  Weight: 126 lb 8.6 oz (57.4 kg)  Height: 5\' 3"  (1.6 m)    Body mass index is 22.42 kg/m. Physical Exam  Constitutional: Oriented to person, place, and time. Well-developed but frail HENT:  Head: Normocephalic.  Mouth/Throat: Oropharynx is clear and moist.  Eyes: Pupils are equal, round, and reactive to light.  Neck: Neck supple.  Cardiovascular: Normal rate and normal heart sounds.  No murmur heard. Pulmonary/Chest: Effort normal and breath sounds normal. No respiratory distress. No wheezes. She has no rales.  Abdominal: Soft. Bowel sounds are normal. No distension. There is no tenderness. There is no rebound.  Musculoskeletal: No edema.  Lymphadenopathy: none Neurological: Alert and oriented to person, place, and time. 4/5 in UE and LE on Left side Speech is clear and No Facial Droop Skin: Skin is warm and dry.  Psychiatric: Normal mood and affect. Behavior is normal. Thought content normal.    Labs reviewed: Basic Metabolic Panel: Recent Labs    10/10/19 0843 04/06/20 1150 04/06/20 1159  NA 140 140 140  K 3.4* 3.9 4.0  CL 104 106 107  CO2 26 25  --   GLUCOSE 118* 104* 102*  BUN 15 20 28*  CREATININE 0.60 0.61 0.60  CALCIUM 8.8* 9.0  --    Liver Function Tests: Recent Labs    10/10/19 0843 04/06/20 1150  AST 23 29  ALT 11 9  ALKPHOS 84 78  BILITOT 0.8 1.0  PROT 7.0 6.6  ALBUMIN 3.9 3.5   Recent Labs    10/10/19 0843  LIPASE 38   No results for input(s): AMMONIA in the last 8760 hours. CBC: Recent Labs    10/10/19 0843 04/06/20 1150 04/06/20 1159  WBC 5.4 5.7  --   NEUTROABS 4.4 3.4  --   HGB 12.3 13.1 12.9  HCT 37.8 41.2 38.0  MCV 93.8 94.5  --   PLT 220 242  --    Cardiac Enzymes: No results for input(s): CKTOTAL, CKMB, CKMBINDEX, TROPONINI in the last 8760 hours. BNP: Invalid input(s): POCBNP Lab Results  Component Value Date   HGBA1C 5.8 (H) 04/07/2020   No results found for: TSH No results found for: VITAMINB12 No results found for: FOLATE No results found for: IRON, TIBC,  FERRITIN  Imaging and Procedures obtained prior to SNF admission: MR ANGIO HEAD WO CONTRAST  Result Date: 04/07/2020 CLINICAL DATA:  Acute onset of left-sided weakness. EXAM: MRI HEAD WITHOUT CONTRAST MRA HEAD WITHOUT CONTRAST TECHNIQUE: Multiplanar, multiecho pulse sequences of the brain and surrounding structures were obtained without intravenous contrast. Angiographic images of the head were obtained using MRA technique without contrast. COMPARISON:  Head CT yesterday. FINDINGS: MRI HEAD FINDINGS Brain: Small acute infarction at the right lateral thalamus/posterior limb internal capsule. No evidence of hemorrhage or mass effect. No other acute finding. Chronic small-vessel ischemic changes affect the pons. Few old small vessel cerebellar infarctions. Cerebral hemispheres show generalized atrophy with moderate to marked chronic small-vessel ischemic changes of the white matter. No cortical or large vessel territory infarction. No mass, hemorrhage, hydrocephalus or extra-axial collection. Vascular: Major vessels at the base of the brain show flow. Skull and upper cervical spine: Negative Sinuses/Orbits: Clear/normal Other: None MRA HEAD FINDINGS Both internal carotid arteries are widely patent into the brain. No siphon stenosis. The anterior and middle cerebral vessels are patent without proximal stenosis, aneurysm or vascular malformation. Both vertebral arteries are widely patent to the basilar. No basilar stenosis. Posterior circulation branch vessels appear normal. IMPRESSION: 1. Small acute infarction at the right lateral thalamus/posterior limb internal capsule. No hemorrhage or mass effect. 2. Chronic small-vessel ischemic changes elsewhere throughout the brain as outlined above. Generalized brain atrophy. 3. Normal intracranial MR angiography of the large and medium size vessels. Electronically Signed   By: Paulina FusiMark  Shogry M.D.   On: 04/07/2020 11:34   MR BRAIN WO CONTRAST  Result Date:  04/07/2020 CLINICAL DATA:  Acute onset of left-sided weakness. EXAM: MRI HEAD WITHOUT CONTRAST MRA HEAD WITHOUT CONTRAST TECHNIQUE: Multiplanar, multiecho pulse sequences of the brain and surrounding structures were obtained without intravenous contrast. Angiographic images of the head were obtained using MRA technique without contrast. COMPARISON:  Head CT yesterday. FINDINGS: MRI HEAD FINDINGS Brain: Small acute infarction at the right lateral thalamus/posterior limb internal capsule. No evidence of hemorrhage or mass effect. No other acute finding.  Chronic small-vessel ischemic changes affect the pons. Few old small vessel cerebellar infarctions. Cerebral hemispheres show generalized atrophy with moderate to marked chronic small-vessel ischemic changes of the white matter. No cortical or large vessel territory infarction. No mass, hemorrhage, hydrocephalus or extra-axial collection. Vascular: Major vessels at the base of the brain show flow. Skull and upper cervical spine: Negative Sinuses/Orbits: Clear/normal Other: None MRA HEAD FINDINGS Both internal carotid arteries are widely patent into the brain. No siphon stenosis. The anterior and middle cerebral vessels are patent without proximal stenosis, aneurysm or vascular malformation. Both vertebral arteries are widely patent to the basilar. No basilar stenosis. Posterior circulation branch vessels appear normal. IMPRESSION: 1. Small acute infarction at the right lateral thalamus/posterior limb internal capsule. No hemorrhage or mass effect. 2. Chronic small-vessel ischemic changes elsewhere throughout the brain as outlined above. Generalized brain atrophy. 3. Normal intracranial MR angiography of the large and medium size vessels. Electronically Signed   By: Paulina Fusi M.D.   On: 04/07/2020 11:34   ECHOCARDIOGRAM COMPLETE  Result Date: 04/06/2020    ECHOCARDIOGRAM REPORT   Patient Name:   SARAI JANUARY Date of Exam: 04/06/2020 Medical Rec #:  [id number]         Height:       63.0 in Accession #:    [id number]       Weight:       126.5 lb Date of Birth:  [birth year]        BSA:          1.592 m Patient Age:    89 years         BP:           156/54 mmHg Patient Gender: F                HR:           63 bpm. Exam Location:  Inpatient Procedure: 2D Echo Indications:    Stroke I63.9  History:        Patient has no prior history of Echocardiogram examinations.                 Risk Factors:Hypertension.  Sonographer:    Thurman Coyer RDCS (AE) Referring Phys: [id number] ASHISH ARORA IMPRESSIONS  1. Left ventricular ejection fraction, by estimation, is 60 to 65%. The left ventricle has normal function. The left ventricle has no regional wall motion abnormalities. There is mild asymmetric left ventricular hypertrophy of the basal and septal segments. Left ventricular diastolic parameters were normal.  2. Right ventricular systolic function is normal. The right ventricular size is normal. There is normal pulmonary artery systolic pressure.  3. The mitral valve is normal in structure. No evidence of mitral valve regurgitation. No evidence of mitral stenosis.  4. The aortic valve is tricuspid. There is mild calcification of the aortic valve. Aortic valve regurgitation is not visualized. Mild aortic valve sclerosis is present, with no evidence of aortic valve stenosis.  5. The inferior vena cava is normal in size with greater than 50% respiratory variability, suggesting right atrial pressure of 3 mmHg. FINDINGS  Left Ventricle: Left ventricular ejection fraction, by estimation, is 60 to 65%. The left ventricle has normal function. The left ventricle has no regional wall motion abnormalities. The left ventricular internal cavity size was normal in size. There is  mild asymmetric left ventricular hypertrophy of the basal and septal segments. Left ventricular diastolic parameters were normal. Right Ventricle: The right ventricular size is normal. No  increase in right ventricular wall  thickness. Right ventricular systolic function is normal. There is normal pulmonary artery systolic pressure. The tricuspid regurgitant velocity is 2.16 m/s, and  with an assumed right atrial pressure of 3 mmHg, the estimated right ventricular systolic pressure is 21.7 mmHg. Left Atrium: Left atrial size was normal in size. Right Atrium: Right atrial size was normal in size. Pericardium: There is no evidence of pericardial effusion. Mitral Valve: The mitral valve is normal in structure. No evidence of mitral valve regurgitation. No evidence of mitral valve stenosis. Tricuspid Valve: The tricuspid valve is normal in structure. Tricuspid valve regurgitation is mild . No evidence of tricuspid stenosis. Aortic Valve: The aortic valve is tricuspid. There is mild calcification of the aortic valve. Aortic valve regurgitation is not visualized. Aortic regurgitation PHT measures 365 msec. Mild aortic valve sclerosis is present, with no evidence of aortic valve stenosis. Pulmonic Valve: The pulmonic valve was normal in structure. Pulmonic valve regurgitation is not visualized. No evidence of pulmonic stenosis. Aorta: The aortic root is normal in size and structure. Venous: The inferior vena cava is normal in size with greater than 50% respiratory variability, suggesting right atrial pressure of 3 mmHg. IAS/Shunts: No atrial level shunt detected by color flow Doppler.  LEFT VENTRICLE PLAX 2D LVIDd:         3.60 cm  Diastology LVIDs:         2.20 cm  LV e' medial:    7.51 cm/s LV PW:         1.10 cm  LV E/e' medial:  12.1 LV IVS:        1.20 cm  LV e' lateral:   7.62 cm/s LVOT diam:     2.10 cm  LV E/e' lateral: 11.9 LV SV:         80 LV SV Index:   50 LVOT Area:     3.46 cm  RIGHT VENTRICLE RV S prime:     12.40 cm/s TAPSE (M-mode): 2.2 cm LEFT ATRIUM             Index       RIGHT ATRIUM           Index LA diam:        3.20 cm 2.01 cm/m  RA Area:     15.20 cm LA Vol (A2C):   27.5 ml 17.28 ml/m RA Volume:   36.90 ml  23.18  ml/m LA Vol (A4C):   32.4 ml 20.35 ml/m LA Biplane Vol: 30.5 ml 19.16 ml/m  AORTIC VALVE LVOT Vmax:   85.80 cm/s LVOT Vmean:  58.200 cm/s LVOT VTI:    0.232 m AI PHT:      365 msec  AORTA Ao Root diam: 3.20 cm MITRAL VALVE                TRICUSPID VALVE MV Area (PHT): 2.66 cm     TR Peak grad:   18.7 mmHg MV Decel Time: 285 msec     TR Vmax:        216.00 cm/s MV E velocity: 90.80 cm/s MV A velocity: 113.00 cm/s  SHUNTS MV E/A ratio:  0.80         Systemic VTI:  0.23 m                             Systemic Diam: 2.10 cm Charlton Haws MD Electronically signed by Charlton Haws MD Signature  Date/Time: 04/06/2020/2:05:07 PM    Final    CT HEAD CODE STROKE WO CONTRAST  Result Date: 04/06/2020 CLINICAL DATA:  Code stroke. EXAM: CT HEAD WITHOUT CONTRAST TECHNIQUE: Contiguous axial images were obtained from the base of the skull through the vertex without intravenous contrast. COMPARISON:  None. FINDINGS: Brain: There is no acute intracranial hemorrhage. Confluent areas of hypoattenuation in the supratentorial greater than pontine white matter are nonspecific probably reflect moderate to advanced chronic microvascular ischemic changes. Prominence of the ventricles and sulci reflects generalized parenchymal volume loss. Gray-white differentiation is preserved. There is no extra-axial fluid collection. Vascular: No hyperdense vessel. There is intracranial atherosclerotic calcification at the skull base. Skull: Unremarkable. Sinuses/Orbits: Minor mucosal thickening. No significant orbital abnormality. Other: Mastoid air cells are clear. ASPECTS ([place] Stroke Program Early CT Score) - Ganglionic level infarction (caudate, lentiform nuclei, internal capsule, insula, M1-M3 cortex): 7 - Supraganglionic infarction (M4-M6 cortex): 3 Total score (0-10 with 10 being normal): 10 IMPRESSION: There is no acute intracranial hemorrhage or evidence of acute infarction. ASPECT score is 10. Moderate to advanced chronic microvascular  ischemic changes. These results were communicated to Dr. Wilford Corner at 12:04 pm on 04/06/2020 by text page via the [facility] messaging system. Electronically Signed   By: Guadlupe Spanish M.D.   On: 04/06/2020 12:09   VAS US CAROTID  Result Date: 04/08/2020 Carotid Arterial Duplex Study Indications:       Weakness and Left side hemiparesis (arm & leg). Risk Factors:      Hypertension, past history of smoking. Comparison Study:  No previous exams Performing Technologist: Ernestene Mention  Examination Guidelines: A complete evaluation includes B-mode imaging, spectral Doppler, color Doppler, and power Doppler as needed of all accessible portions of each vessel. Bilateral testing is considered an integral part of a complete examination. Limited examinations for reoccurring indications may be performed as noted.  Right Carotid Findings: +----------+--------+--------+--------+------------------+------------------+           PSV cm/sEDV cm/sStenosisPlaque DescriptionComments           +----------+--------+--------+--------+------------------+------------------+ CCA Prox  59      13                                intimal thickening +----------+--------+--------+--------+------------------+------------------+ CCA Distal47      5                                                    +----------+--------+--------+--------+------------------+------------------+ ICA Prox  61      12                                                   +----------+--------+--------+--------+------------------+------------------+ ICA Distal61      15                                                   +----------+--------+--------+--------+------------------+------------------+ ECA       87                                                           +----------+--------+--------+--------+------------------+------------------+ +----------+--------+-------+----------------+-------------------+  PSV cm/sEDV cmsDescribe         Arm Pressure (mmHG) +----------+--------+-------+----------------+-------------------+ [id number]            Multiphasic, WNL                    +----------+--------+-------+----------------+-------------------+ +---------+--------+--+--------+-+---------+ VertebralPSV cm/s49EDV cm/s7Antegrade +---------+--------+--+--------+-+---------+  Left Carotid Findings: +----------+--------+-------+--------+----------------------+------------------+           PSV cm/sEDV    StenosisPlaque Description    Comments                             cm/s                                                    +----------+--------+-------+--------+----------------------+------------------+ CCA Prox  63      10                                   intimal thickening +----------+--------+-------+--------+----------------------+------------------+ CCA Distal55      11             smooth and            intimal thickening                                  heterogenous                             +----------+--------+-------+--------+----------------------+------------------+ ICA Prox  65      12                                                      +----------+--------+-------+--------+----------------------+------------------+ ICA Distal71      16                                                      +----------+--------+-------+--------+----------------------+------------------+ ECA       65                                                              +----------+--------+-------+--------+----------------------+------------------+ +----------+--------+--------+----------------+-------------------+           PSV cm/sEDV cm/sDescribe        Arm Pressure (mmHG) +----------+--------+--------+----------------+-------------------+ [id number]             Multiphasic, WNL                    +----------+--------+--------+----------------+-------------------+  +---------+--------+--+--------+--+---------+ VertebralPSV cm/s50EDV cm/s10Antegrade +---------+--------+--+--------+--+---------+   Summary: Right Carotid: The extracranial vessels were near-normal with only minimal wall                thickening or plaque.  Left Carotid: The extracranial vessels were near-normal with only minimal wall               thickening or plaque. Vertebrals:  Bilateral vertebral arteries demonstrate antegrade flow. Subclavians: Normal flow hemodynamics were seen in bilateral subclavian              arteries. *See table(s) above for measurements and observations.  Electronically signed by Gretta Began MD on 04/08/2020 at 6:05:42 AM.    Final     Assessment/Plan Acute ischemic stroke [facility]) S/p TPA Aspirin and Plavix for 3 weeks Then only Plavix On Statin  Primary hypertension Dropped her BP this morning with dizziness Change Norvasc to 5 mg Qd for now again  Hypothyroidism, unspecified type Repeat TSH  Dizziness Has meclizine PRN Avoid Postural Hypotension  Hyperlipidemia, unspecified hyperlipidemia type On statin    Family/ staff Communication:   Labs/tests ordered:CBC,CMP,TSh

## 2020-04-10 NOTE — Progress Notes (Signed)
Late entry for missed scoring of the Modified Rankin Scale.  Score based on review of the medical record and not direct observation.    04/07/20 1600  Modified Rankin (Stroke Patients Only)  Pre-Morbid Rankin Score 3  Modified Rankin 5   [place]ona [place], [place]   Acute Rehabilitation Services  Pager [phone] Office [phone] 04/10/2020

## 2020-04-16 LAB — COMPREHENSIVE METABOLIC PANEL
Albumin: 3.5 (ref 3.5–5.0)
Calcium: 8.8 (ref 8.7–10.7)
GFR calc Af Amer: 93
GFR calc non Af Amer: 80
Globulin: 2.3

## 2020-04-16 LAB — HEPATIC FUNCTION PANEL
ALT: 11 (ref 7–35)
AST: 18 (ref 13–35)
Alkaline Phosphatase: 92 (ref 25–125)
Bilirubin, Total: 0.5

## 2020-04-16 LAB — TSH: TSH: 2.62 (ref 0.41–5.90)

## 2020-04-16 LAB — BASIC METABOLIC PANEL
BUN: 18 (ref 4–21)
CO2: 27 — AB (ref 13–22)
Chloride: 105 (ref 99–108)
Creatinine: 0.5 (ref 0.5–1.1)
Glucose: 79
Potassium: 3.8 (ref 3.4–5.3)
Sodium: 141 (ref 137–147)

## 2020-04-16 LAB — CBC AND DIFFERENTIAL
HCT: 36 (ref 36–46)
Hemoglobin: 11.6 — AB (ref 12.0–16.0)
Neutrophils Absolute: 3053
Platelets: 297 (ref 150–399)
WBC: 5.5

## 2020-04-16 LAB — CBC: RBC: 3.96 (ref 3.87–5.11)

## 2020-04-28 ENCOUNTER — Encounter: Payer: Self-pay | Admitting: Nurse Practitioner

## 2020-04-28 ENCOUNTER — Non-Acute Institutional Stay (SKILLED_NURSING_FACILITY): Payer: Medicare Other | Admitting: Nurse Practitioner

## 2020-04-28 DIAGNOSIS — R42 Dizziness and giddiness: Secondary | ICD-10-CM | POA: Diagnosis not present

## 2020-04-28 DIAGNOSIS — E785 Hyperlipidemia, unspecified: Secondary | ICD-10-CM | POA: Insufficient documentation

## 2020-04-28 DIAGNOSIS — R7303 Prediabetes: Secondary | ICD-10-CM

## 2020-04-28 DIAGNOSIS — E039 Hypothyroidism, unspecified: Secondary | ICD-10-CM | POA: Diagnosis not present

## 2020-04-28 DIAGNOSIS — K219 Gastro-esophageal reflux disease without esophagitis: Secondary | ICD-10-CM

## 2020-04-28 DIAGNOSIS — I639 Cerebral infarction, unspecified: Secondary | ICD-10-CM

## 2020-04-28 DIAGNOSIS — E782 Mixed hyperlipidemia: Secondary | ICD-10-CM

## 2020-04-28 DIAGNOSIS — N39498 Other specified urinary incontinence: Secondary | ICD-10-CM

## 2020-04-28 NOTE — Progress Notes (Signed)
Location:   SNF [facility] Room Number: 21 Place of Service:  SNF (31) Provider: Arna Snipe Kenyona Rena NP  Charlane Ferretti, DO  Patient Care Team: Charlane Ferretti, DO as PCP - General (Internal Medicine)  Extended Emergency Contact Information Primary Emergency Contact: Elease Etienne [place] Mobile Phone: [phone] Relation: Son Secondary Emergency Contact: Towry,Jeffrey Mobile Phone: [phone] Relation: Son  Code Status:  DNR Goals of care: Advanced Directive information Advanced Directives 04/09/2020  Does Patient Have a Medical Advance Directive? Yes  Type of Advance Directive Living will;Healthcare Power of Attorney  Does patient want to make changes to medical advance directive? No - Patient declined  Copy of Healthcare Power of Attorney in Chart? No - copy requested     Chief Complaint  Patient presents with  . Medical Management of Chronic Issues    HPI:  Pt is a 87 y.o. female seen today for medical management of chronic diseases.    Ischemic stroke, s/p TPA, takes Plavix after initial 3 weeks dural therapy of ASA/Plavix. Takes Atorvastatin   Hypothyroidism, takes Levothyroxine TSH 2.62 04/16/20  HTN, takes Amlodipine 5mg  qd since 04/09/20  Dizziness, prn Meclizine  Hyperlipidemia, takes Atorvastain, LDL 96 04/07/20  Prediabetes, Hgb a1c 5.8 04/07/20   Past Medical History:  Diagnosis Date  . Arthritis   . Hypertension    Past Surgical History:  Procedure Laterality Date  . ABDOMINAL HYSTERECTOMY    . JOINT REPLACEMENT Right 1995  . JOINT REPLACEMENT Left 1997    Allergies  Allergen Reactions  . Sulfa Antibiotics Hives    Allergies as of 04/28/2020      Reactions   Sulfa Antibiotics Hives      Medication List       Accurate as of April 28, 2020 11:59 PM. If you have any questions, ask your nurse or doctor.        amLODipine 10 MG tablet Commonly known as: NORVASC Take 1 tablet (10 mg total) by mouth daily. What changed:  how much to take   aspirin EC 81 MG tablet Take 81 mg by mouth daily. Swallow whole.   atorvastatin 10 MG tablet Commonly known as: LIPITOR Take 2 tablets (20 mg total) by mouth daily.   cholecalciferol 25 MCG (1000 UNIT) tablet Commonly known as: VITAMIN D3 Take 1,000 Units by mouth daily.   clopidogrel 75 MG tablet Commonly known as: PLAVIX Take 1 tablet (75 mg total) by mouth daily.   ICAPS AREDS 2 PO Take 1 capsule by mouth 2 (two) times daily.   levothyroxine 50 MCG tablet Commonly known as: SYNTHROID Take 50 mcg by mouth daily.   meclizine 12.5 MG tablet Commonly known as: ANTIVERT Take 1 tablet (12.5 mg total) by mouth 3 (three) times daily as needed for dizziness.   zinc oxide 20 % ointment Apply 1 application topically as needed for irritation.       Review of Systems  Constitutional: Negative for activity change, appetite change and fever.  HENT: Positive for hearing loss. Negative for congestion, trouble swallowing and voice change.   Respiratory: Positive for stridor. Negative for cough, shortness of breath and wheezing.   Cardiovascular: Negative for chest pain, palpitations and leg swelling.  Gastrointestinal: Negative for abdominal pain, constipation, nausea and vomiting.       Vomited x1 after breakfast since admitted to SN[facility]. Occasional indigestion.   Genitourinary: Positive for frequency. Negative for dysuria and urgency.       2-3x/night, urinary leakage, uses  pads.   Musculoskeletal: Positive for gait problem.  Skin: Negative for color change.  Neurological: Negative for syncope, speech difficulty, weakness, light-headedness and headaches.  Psychiatric/Behavioral: Negative for confusion and sleep disturbance. The patient is not nervous/anxious.     Immunization History  Administered Date(s) Administered  . Influenza, High Dose Seasonal PF 10/17/2018   Pertinent  Health Maintenance Due  Topic Date Due  . DEXA SCAN  Never done  . PNA vac  Low Risk Adult (1 of 2 - PCV13) Never done  . INFLUENZA VACCINE  08/03/2020   No flowsheet data found. Functional Status Survey:    Vitals:   04/28/20 1321  BP: (!) 147/72  Pulse: 71  Resp: 20  Temp: (!) 97.3 F (36.3 C)  SpO2: 97%   There is no height or weight on file to calculate BMI. Physical Exam Vitals and nursing note reviewed.  Constitutional:      Appearance: Normal appearance.  HENT:     Head: Normocephalic and atraumatic.     Nose: Nose normal.     Mouth/Throat:     Mouth: Mucous membranes are moist.  Eyes:     Extraocular Movements: Extraocular movements intact.     Conjunctiva/sclera: Conjunctivae normal.     Pupils: Pupils are equal, round, and reactive to light.  Cardiovascular:     Rate and Rhythm: Normal rate and regular rhythm.     Heart sounds: No murmur heard.   Pulmonary:     Effort: Pulmonary effort is normal.     Breath sounds: No wheezing, rhonchi or rales.  Abdominal:     Palpations: Abdomen is soft.     Tenderness: There is no abdominal tenderness.  Musculoskeletal:     Cervical back: Normal range of motion and neck supple.     Right lower leg: No edema.     Left lower leg: No edema.  Skin:    General: Skin is warm and dry.  Neurological:     General: No focal deficit present.     Mental Status: She is alert and oriented to person, place, and time. Mental status is at baseline.     Motor: No weakness.     Coordination: Coordination normal.     Gait: Gait abnormal.  Psychiatric:        Mood and Affect: Mood normal.        Behavior: Behavior normal.        Thought Content: Thought content normal.     Labs reviewed: Recent Labs    10/10/19 0843 04/06/20 1150 04/06/20 1159  NA 140 140 140  K 3.4* 3.9 4.0  CL 104 106 107  CO2 26 25  --   GLUCOSE 118* 104* 102*  BUN 15 20 28*  CREATININE 0.60 0.61 0.60  CALCIUM 8.8* 9.0  --    Recent Labs    10/10/19 0843 04/06/20 1150  AST 23 29  ALT 11 9  ALKPHOS 84 78  BILITOT  0.8 1.0  PROT 7.0 6.6  ALBUMIN 3.9 3.5   Recent Labs    10/10/19 0843 04/06/20 1150 04/06/20 1159  WBC 5.4 5.7  --   NEUTROABS 4.4 3.4  --   HGB 12.3 13.1 12.9  HCT 37.8 41.2 38.0  MCV 93.8 94.5  --   PLT 220 242  --    No results found for: TSH Lab Results  Component Value Date   HGBA1C 5.8 (H) 04/07/2020   Lab Results  Component Value Date  CHOL 170 04/07/2020   HDL 58 04/07/2020   LDLCALC 96 04/07/2020   TRIG 80 04/07/2020   CHOLHDL 2.9 04/07/2020    Significant Diagnostic Results in last 30 days:  MR ANGIO HEAD WO CONTRAST  Result Date: 04/07/2020 CLINICAL DATA:  Acute onset of left-sided weakness. EXAM: MRI HEAD WITHOUT CONTRAST MRA HEAD WITHOUT CONTRAST TECHNIQUE: Multiplanar, multiecho pulse sequences of the brain and surrounding structures were obtained without intravenous contrast. Angiographic images of the head were obtained using MRA technique without contrast. COMPARISON:  Head CT yesterday. FINDINGS: MRI HEAD FINDINGS Brain: Small acute infarction at the right lateral thalamus/posterior limb internal capsule. No evidence of hemorrhage or mass effect. No other acute finding. Chronic small-vessel ischemic changes affect the pons. Few old small vessel cerebellar infarctions. Cerebral hemispheres show generalized atrophy with moderate to marked chronic small-vessel ischemic changes of the white matter. No cortical or large vessel territory infarction. No mass, hemorrhage, hydrocephalus or extra-axial collection. Vascular: Major vessels at the base of the brain show flow. Skull and upper cervical spine: Negative Sinuses/Orbits: Clear/normal Other: None MRA HEAD FINDINGS Both internal carotid arteries are widely patent into the brain. No siphon stenosis. The anterior and middle cerebral vessels are patent without proximal stenosis, aneurysm or vascular malformation. Both vertebral arteries are widely patent to the basilar. No basilar stenosis. Posterior circulation branch  vessels appear normal. IMPRESSION: 1. Small acute infarction at the right lateral thalamus/posterior limb internal capsule. No hemorrhage or mass effect. 2. Chronic small-vessel ischemic changes elsewhere throughout the brain as outlined above. Generalized brain atrophy. 3. Normal intracranial MR angiography of the large and medium size vessels. Electronically Signed   By: Paulina Fusi M.D.   On: 04/07/2020 11:34   MR BRAIN WO CONTRAST  Result Date: 04/07/2020 CLINICAL DATA:  Acute onset of left-sided weakness. EXAM: MRI HEAD WITHOUT CONTRAST MRA HEAD WITHOUT CONTRAST TECHNIQUE: Multiplanar, multiecho pulse sequences of the brain and surrounding structures were obtained without intravenous contrast. Angiographic images of the head were obtained using MRA technique without contrast. COMPARISON:  Head CT yesterday. FINDINGS: MRI HEAD FINDINGS Brain: Small acute infarction at the right lateral thalamus/posterior limb internal capsule. No evidence of hemorrhage or mass effect. No other acute finding. Chronic small-vessel ischemic changes affect the pons. Few old small vessel cerebellar infarctions. Cerebral hemispheres show generalized atrophy with moderate to marked chronic small-vessel ischemic changes of the white matter. No cortical or large vessel territory infarction. No mass, hemorrhage, hydrocephalus or extra-axial collection. Vascular: Major vessels at the base of the brain show flow. Skull and upper cervical spine: Negative Sinuses/Orbits: Clear/normal Other: None MRA HEAD FINDINGS Both internal carotid arteries are widely patent into the brain. No siphon stenosis. The anterior and middle cerebral vessels are patent without proximal stenosis, aneurysm or vascular malformation. Both vertebral arteries are widely patent to the basilar. No basilar stenosis. Posterior circulation branch vessels appear normal. IMPRESSION: 1. Small acute infarction at the right lateral thalamus/posterior limb internal capsule. No  hemorrhage or mass effect. 2. Chronic small-vessel ischemic changes elsewhere throughout the brain as outlined above. Generalized brain atrophy. 3. Normal intracranial MR angiography of the large and medium size vessels. Electronically Signed   By: Paulina Fusi M.D.   On: 04/07/2020 11:34   ECHOCARDIOGRAM COMPLETE  Result Date: 04/06/2020    ECHOCARDIOGRAM REPORT   Patient Name:   Shelley Sloan Date of Exam: 04/06/2020 Medical Rec #:  [id number]        Height:  63.0 in Accession #:    [id number]       Weight:       126.5 lb Date of Birth:  [birth year]        BSA:          1.592 m Patient Age:    89 years         BP:           156/54 mmHg Patient Gender: F                HR:           63 bpm. Exam Location:  Inpatient Procedure: 2D Echo Indications:    Stroke I63.9  History:        Patient has no prior history of Echocardiogram examinations.                 Risk Factors:Hypertension.  Sonographer:    Thurman Coyer RDCS (AE) Referring Phys: [id number] ASHISH ARORA IMPRESSIONS  1. Left ventricular ejection fraction, by estimation, is 60 to 65%. The left ventricle has normal function. The left ventricle has no regional wall motion abnormalities. There is mild asymmetric left ventricular hypertrophy of the basal and septal segments. Left ventricular diastolic parameters were normal.  2. Right ventricular systolic function is normal. The right ventricular size is normal. There is normal pulmonary artery systolic pressure.  3. The mitral valve is normal in structure. No evidence of mitral valve regurgitation. No evidence of mitral stenosis.  4. The aortic valve is tricuspid. There is mild calcification of the aortic valve. Aortic valve regurgitation is not visualized. Mild aortic valve sclerosis is present, with no evidence of aortic valve stenosis.  5. The inferior vena cava is normal in size with greater than 50% respiratory variability, suggesting right atrial pressure of 3 mmHg. FINDINGS  Left Ventricle: Left  ventricular ejection fraction, by estimation, is 60 to 65%. The left ventricle has normal function. The left ventricle has no regional wall motion abnormalities. The left ventricular internal cavity size was normal in size. There is  mild asymmetric left ventricular hypertrophy of the basal and septal segments. Left ventricular diastolic parameters were normal. Right Ventricle: The right ventricular size is normal. No increase in right ventricular wall thickness. Right ventricular systolic function is normal. There is normal pulmonary artery systolic pressure. The tricuspid regurgitant velocity is 2.16 m/s, and  with an assumed right atrial pressure of 3 mmHg, the estimated right ventricular systolic pressure is 21.7 mmHg. Left Atrium: Left atrial size was normal in size. Right Atrium: Right atrial size was normal in size. Pericardium: There is no evidence of pericardial effusion. Mitral Valve: The mitral valve is normal in structure. No evidence of mitral valve regurgitation. No evidence of mitral valve stenosis. Tricuspid Valve: The tricuspid valve is normal in structure. Tricuspid valve regurgitation is mild . No evidence of tricuspid stenosis. Aortic Valve: The aortic valve is tricuspid. There is mild calcification of the aortic valve. Aortic valve regurgitation is not visualized. Aortic regurgitation PHT measures 365 msec. Mild aortic valve sclerosis is present, with no evidence of aortic valve stenosis. Pulmonic Valve: The pulmonic valve was normal in structure. Pulmonic valve regurgitation is not visualized. No evidence of pulmonic stenosis. Aorta: The aortic root is normal in size and structure. Venous: The inferior vena cava is normal in size with greater than 50% respiratory variability, suggesting right atrial pressure of 3 mmHg. IAS/Shunts: No atrial level shunt detected by color flow Doppler.  LEFT VENTRICLE  PLAX 2D LVIDd:         3.60 cm  Diastology LVIDs:         2.20 cm  LV e' medial:    7.51 cm/s LV  PW:         1.10 cm  LV E/e' medial:  12.1 LV IVS:        1.20 cm  LV e' lateral:   7.62 cm/s LVOT diam:     2.10 cm  LV E/e' lateral: 11.9 LV SV:         80 LV SV Index:   50 LVOT Area:     3.46 cm  RIGHT VENTRICLE RV S prime:     12.40 cm/s TAPSE (M-mode): 2.2 cm LEFT ATRIUM             Index       RIGHT ATRIUM           Index LA diam:        3.20 cm 2.01 cm/m  RA Area:     15.20 cm LA Vol (A2C):   27.5 ml 17.28 ml/m RA Volume:   36.90 ml  23.18 ml/m LA Vol (A4C):   32.4 ml 20.35 ml/m LA Biplane Vol: 30.5 ml 19.16 ml/m  AORTIC VALVE LVOT Vmax:   85.80 cm/s LVOT Vmean:  58.200 cm/s LVOT VTI:    0.232 m AI PHT:      365 msec  AORTA Ao Root diam: 3.20 cm MITRAL VALVE                TRICUSPID VALVE MV Area (PHT): 2.66 cm     TR Peak grad:   18.7 mmHg MV Decel Time: 285 msec     TR Vmax:        216.00 cm/s MV E velocity: 90.80 cm/s MV A velocity: 113.00 cm/s  SHUNTS MV E/A ratio:  0.80         Systemic VTI:  0.23 m                             Systemic Diam: 2.10 cm Charlton Haws MD Electronically signed by Charlton Haws MD Signature Date/Time: 04/06/2020/2:05:07 PM    Final    CT HEAD CODE STROKE WO CONTRAST  Result Date: 04/06/2020 CLINICAL DATA:  Code stroke. EXAM: CT HEAD WITHOUT CONTRAST TECHNIQUE: Contiguous axial images were obtained from the base of the skull through the vertex without intravenous contrast. COMPARISON:  None. FINDINGS: Brain: There is no acute intracranial hemorrhage. Confluent areas of hypoattenuation in the supratentorial greater than pontine white matter are nonspecific probably reflect moderate to advanced chronic microvascular ischemic changes. Prominence of the ventricles and sulci reflects generalized parenchymal volume loss. Gray-white differentiation is preserved. There is no extra-axial fluid collection. Vascular: No hyperdense vessel. There is intracranial atherosclerotic calcification at the skull base. Skull: Unremarkable. Sinuses/Orbits: Minor mucosal thickening. No  significant orbital abnormality. Other: Mastoid air cells are clear. ASPECTS ([place]berta Stroke Program Early CT Score) - Ganglionic level infarction (caudate, lentiform nuclei, internal capsule, insula, M1-M3 cortex): 7 - Supraganglionic infarction (M4-M6 cortex): 3 Total score (0-10 with 10 being normal): 10 IMPRESSION: There is no acute intracranial hemorrhage or evidence of acute infarction. ASPECT score is 10. Moderate to advanced chronic microvascular ischemic changes. These results were communicated to Dr. Wilford Corner at 12:04 pm on 04/06/2020 by text page via the [facility] messaging system. Electronically Signed   By: Jackquline Berlin.D.  On: 04/06/2020 12:09   VAS US CAROTID  Result Date: 04/08/2020 Carotid Arterial Duplex Study Indications:       Weakness and Left side hemiparesis (arm & leg). Risk Factors:      Hypertension, past history of smoking. Comparison Study:  No previous exams Performing Technologist: Ernestene Mention  Examination Guidelines: A complete evaluation includes B-mode imaging, spectral Doppler, color Doppler, and power Doppler as needed of all accessible portions of each vessel. Bilateral testing is considered an integral part of a complete examination. Limited examinations for reoccurring indications may be performed as noted.  Right Carotid Findings: +----------+--------+--------+--------+------------------+------------------+           PSV cm/sEDV cm/sStenosisPlaque DescriptionComments           +----------+--------+--------+--------+------------------+------------------+ CCA Prox  59      13                                intimal thickening +----------+--------+--------+--------+------------------+------------------+ CCA Distal47      5                                                    +----------+--------+--------+--------+------------------+------------------+ ICA Prox  61      12                                                    +----------+--------+--------+--------+------------------+------------------+ ICA Distal61      15                                                   +----------+--------+--------+--------+------------------+------------------+ ECA       87                                                           +----------+--------+--------+--------+------------------+------------------+ +----------+--------+-------+----------------+-------------------+           PSV cm/sEDV cmsDescribe        Arm Pressure (mmHG) +----------+--------+-------+----------------+-------------------+ [id number]            Multiphasic, WNL                    +----------+--------+-------+----------------+-------------------+ +---------+--------+--+--------+-+---------+ VertebralPSV cm/s49EDV cm/s7Antegrade +---------+--------+--+--------+-+---------+  Left Carotid Findings: +----------+--------+-------+--------+----------------------+------------------+           PSV cm/sEDV    StenosisPlaque Description    Comments                             cm/s                                                    +----------+--------+-------+--------+----------------------+------------------+ CCA Prox  63  10                                   intimal thickening +----------+--------+-------+--------+----------------------+------------------+ CCA Distal55      11             smooth and            intimal thickening                                  heterogenous                             +----------+--------+-------+--------+----------------------+------------------+ ICA Prox  65      12                                                      +----------+--------+-------+--------+----------------------+------------------+ ICA Distal71      16                                                      +----------+--------+-------+--------+----------------------+------------------+ ECA        65                                                              +----------+--------+-------+--------+----------------------+------------------+ +----------+--------+--------+----------------+-------------------+           PSV cm/sEDV cm/sDescribe        Arm Pressure (mmHG) +----------+--------+--------+----------------+-------------------+ [id number]             Multiphasic, WNL                    +----------+--------+--------+----------------+-------------------+ +---------+--------+--+--------+--+---------+ VertebralPSV cm/s50EDV cm/s10Antegrade +---------+--------+--+--------+--+---------+   Summary: Right Carotid: The extracranial vessels were near-normal with only minimal wall                thickening or plaque. Left Carotid: The extracranial vessels were near-normal with only minimal wall               thickening or plaque. Vertebrals:  Bilateral vertebral arteries demonstrate antegrade flow. Subclavians: Normal flow hemodynamics were seen in bilateral subclavian              arteries. *See table(s) above for measurements and observations.  Electronically signed by Gretta Began MD on 04/08/2020 at 6:05:42 AM.    Final     Assessment/Plan Prediabetes Diet controlled, Hgb a1c 5.8 04/07/20  Hyperlipidemia takes Atorvastain, LDL 96 04/07/20   Dizziness Change position slowly, prn Meclizine   Hypothyroidism takes Levothyroxine, TSH 2.62 04/16/20  Acute ischemic stroke (HCC) Ischemic stroke, onset 04/06/2020, s/p TPA, takes Plavix after initial 3 weeks dural therapy of ASA/Plavix. Takes Atorvastatin   GERD (gastroesophageal reflux disease) Vomited x1 since admitted to SNF [facility], c/o indigestion occasionally, adding Omeprazole  qd in setting of ASA use.   Frequent urinary incontinence  2-3x/night, urinary leakage, uses pads.     Family/ staff Communication: plan of care reviewed with the patient and charge nurse.   Labs/tests ordered: none  Time spend 25 minutes.

## 2020-04-28 NOTE — Assessment & Plan Note (Signed)
takes Atorvastain, LDL 96 04/07/20

## 2020-04-28 NOTE — Assessment & Plan Note (Addendum)
takes Levothyroxine, TSH 2.62 04/16/20

## 2020-04-28 NOTE — Assessment & Plan Note (Signed)
2-3x/night, urinary leakage, uses pads.

## 2020-04-28 NOTE — Assessment & Plan Note (Signed)
Ischemic stroke, onset 04/06/2020, s/p TPA, takes Plavix after initial 3 weeks dural therapy of ASA/Plavix. Takes Atorvastatin

## 2020-04-28 NOTE — Assessment & Plan Note (Signed)
Vomited x1 since admitted to SNF [facility], c/o indigestion occasionally, adding Omeprazole 20mg  qd in setting of ASA use.

## 2020-04-28 NOTE — Assessment & Plan Note (Signed)
Diet controlled, Hgb a1c 5.8 04/07/20

## 2020-04-28 NOTE — Assessment & Plan Note (Signed)
Change position slowly, prn Meclizine

## 2020-04-29 ENCOUNTER — Encounter: Payer: Self-pay | Admitting: Nurse Practitioner

## 2020-04-30 ENCOUNTER — Non-Acute Institutional Stay (SKILLED_NURSING_FACILITY): Payer: Medicare Other | Admitting: Internal Medicine

## 2020-04-30 DIAGNOSIS — Z8673 Personal history of transient ischemic attack (TIA), and cerebral infarction without residual deficits: Secondary | ICD-10-CM | POA: Diagnosis not present

## 2020-04-30 DIAGNOSIS — E039 Hypothyroidism, unspecified: Secondary | ICD-10-CM

## 2020-04-30 DIAGNOSIS — I10 Essential (primary) hypertension: Secondary | ICD-10-CM | POA: Diagnosis not present

## 2020-04-30 DIAGNOSIS — K219 Gastro-esophageal reflux disease without esophagitis: Secondary | ICD-10-CM

## 2020-04-30 DIAGNOSIS — R21 Rash and other nonspecific skin eruption: Secondary | ICD-10-CM | POA: Diagnosis not present

## 2020-04-30 DIAGNOSIS — E782 Mixed hyperlipidemia: Secondary | ICD-10-CM

## 2020-04-30 DIAGNOSIS — R42 Dizziness and giddiness: Secondary | ICD-10-CM

## 2020-04-30 MED ORDER — AMLODIPINE BESYLATE 10 MG PO TABS: 5.0000 mg | ORAL_TABLET | Freq: Every day | ORAL | 0 refills | Status: AC

## 2020-04-30 MED ORDER — AMLODIPINE BESYLATE 10 MG PO TABS: 5.0000 mg | ORAL_TABLET | Freq: Every day | ORAL | 0 refills | Status: DC

## 2020-04-30 NOTE — Progress Notes (Signed)
[facility] Service:  SNF (31)  Provider:   Code Status:  Goals of Care:  Advanced Directives 04/09/2020  Does Patient Have a Medical Advance Directive? Yes  Type of Advance Directive Living will;Healthcare Power of Attorney  Does patient want to make changes to medical advance directive? No - Patient declined  Copy of Healthcare Power of Attorney in Chart? No - copy requested     Chief Complaint  Patient presents with  . Acute Visit    HPI: Patient is a 87 y.o. female seen today for an acute visit for Rash on her Abdomen and Under her Breast. 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 Patient has h/o HTN, HLD,Arthiritis, Vertigo  [place] And per her son was very independent in her ADLS. Walked with her walker She noticed weakness in her Left arm and leg in the morning called her son and then facility nurse Was send to the ED CT scan Negative for any bleed Given TPA.  Her MRI was positive for ischemic infarct of the right leg thalamus and posterior internal capsule.  She was started on aspirin and Plavix.  Has done well in SNF. Walking with Mild Assist. C/o Some Dizziness/lighheadiness sometimes when she is sitting or walking but no falls Also has noticed redness and itching in around her stomach and also Below her breast. No New Lotion or Cream or Soap  Past Medical History:  Diagnosis Date  . Arthritis   . Hypertension     Past Surgical History:  Procedure Laterality Date  . ABDOMINAL HYSTERECTOMY    . JOINT REPLACEMENT Right 1995  . JOINT REPLACEMENT Left 1997    Allergies  Allergen Reactions  . Sulfa Antibiotics Hives    Outpatient Encounter Medications as of 04/30/2020  Medication Sig  . omeprazole (PRILOSEC) 20 MG capsule Take 20 mg by mouth daily.  Marland Kitchen amLODipine (NORVASC) 10 MG tablet Take 0.5 tablets (5 mg  total) by mouth daily.  Marland Kitchen atorvastatin (LIPITOR) 10 MG tablet Take 2 tablets (20 mg total) by mouth daily.  . cholecalciferol (VITAMIN D3) 25 MCG (1000 UNIT) tablet Take 1,000 Units by mouth daily.  . clopidogrel (PLAVIX) 75 MG tablet Take 1 tablet (75 mg total) by mouth daily.  Marland Kitchen levothyroxine (SYNTHROID) 50 MCG tablet Take 50 mcg by mouth daily.  . meclizine (ANTIVERT) 12.5 MG tablet Take 1 tablet (12.5 mg total) by mouth 3 (three) times daily as needed for dizziness.  . Multiple Vitamins-Minerals (ICAPS AREDS 2 PO) Take 1 capsule by mouth 2 (two) times daily.  Marland Kitchen zinc oxide 20 % ointment Apply 1 application topically as needed for irritation.  . [DISCONTINUED] amLODipine (NORVASC) 10 MG tablet Take 1 tablet (10 mg total) by mouth daily. (Patient taking differently: Take 5 mg by mouth daily.)  . [DISCONTINUED] amLODipine (NORVASC) 10 MG tablet Take 0.5 tablets (5 mg total) by mouth daily.  . [DISCONTINUED] aspirin EC 81 MG tablet Take 81 mg by mouth daily. Swallow whole.   No facility-administered encounter medications on file as of 04/30/2020.    Review of Systems:  Review of Systems Review of Systems  Constitutional: Negative for activity change, appetite change, chills, diaphoresis, fatigue and fever.  HENT: Negative for mouth sores, postnasal drip, rhinorrhea, sinus pain and sore throat.   Respiratory: Negative for apnea, cough, chest tightness, shortness  of breath and wheezing.   Cardiovascular: Negative for chest pain, palpitations and leg swelling.  Gastrointestinal: Negative for abdominal distention, abdominal pain, constipation, diarrhea, nausea and vomiting.  Genitourinary: Negative for dysuria and frequency.  Musculoskeletal: Negative for arthralgias, joint swelling and myalgias.  Skin: Positive for rash.  Neurological: Negative for  syncope, weakness,  and numbness.  Psychiatric/Behavioral: Negative for behavioral problems, confusion and sleep disturbance.    Health  Maintenance  Topic Date Due  . COVID-19 Vaccine (1) Never done  . TETANUS/TDAP  Never done  . DEXA SCAN  Never done  . PNA vac Low Risk Adult (1 of 2 - PCV13) Never done  . INFLUENZA VACCINE  08/03/2020  . HPV VACCINES  Aged Out    Physical Exam: Vitals:   05/01/20 0909  BP: 127/76  Pulse: 81  Resp: 18  Temp: (!) 97.1 F (36.2 C)  SpO2: 97%  Weight: 126 lb (57.2 kg)   Body mass index is 22.32 kg/m. Physical Exam Constitutional: Oriented to person, place, and time. Well-developed and well-nourished.  HENT:  Head: Normocephalic.  Mouth/Throat: Oropharynx is clear and moist.  Eyes: Pupils are equal, round, and reactive to light.  Neck: Neck supple.  Cardiovascular: Normal rate and normal heart sounds.  No murmur heard. Pulmonary/Chest: Effort normal and breath sounds normal. No respiratory distress. No wheezes. She has no rales.  Abdominal: Soft. Bowel sounds are normal. No distension. There is no tenderness. There is no rebound.  Musculoskeletal: No edema.  Lymphadenopathy: none Neurological: Alert and oriented to person, place, and time. 4/5 in UE and LE on Left side Speech is clear and No Facial Droop Skin: Skin is warm and dry. Has Macular Papular Rash under her breast and on abdomen Psychiatric: Normal mood and affect. Behavior is normal. Thought content normal.   Labs reviewed: Basic Metabolic Panel: Recent Labs    10/10/19 0843 04/06/20 1150 04/06/20 1159  NA 140 140 140  K 3.4* 3.9 4.0  CL 104 106 107  CO2 26 25  --   GLUCOSE 118* 104* 102*  BUN 15 20 28*  CREATININE 0.60 0.61 0.60  CALCIUM 8.8* 9.0  --    Liver Function Tests: Recent Labs    10/10/19 0843 04/06/20 1150  AST 23 29  ALT 11 9  ALKPHOS 84 78  BILITOT 0.8 1.0  PROT 7.0 6.6  ALBUMIN 3.9 3.5   Recent Labs    10/10/19 0843  LIPASE 38   No results for input(s): AMMONIA in the last 8760 hours. CBC: Recent Labs    10/10/19 0843 04/06/20 1150 04/06/20 1159  WBC 5.4 5.7  --    NEUTROABS 4.4 3.4  --   HGB 12.3 13.1 12.9  HCT 37.8 41.2 38.0  MCV 93.8 94.5  --   PLT 220 242  --    Lipid Panel: Recent Labs    04/07/20 0150  CHOL 170  HDL 58  LDLCALC 96  TRIG 80  CHOLHDL 2.9   Lab Results  Component Value Date   HGBA1C 5.8 (H) 04/07/2020    Procedures since last visit: MR ANGIO HEAD WO CONTRAST  Result Date: 04/07/2020 CLINICAL DATA:  Acute onset of left-sided weakness. EXAM: MRI HEAD WITHOUT CONTRAST MRA HEAD WITHOUT CONTRAST TECHNIQUE: Multiplanar, multiecho pulse sequences of the brain and surrounding structures were obtained without intravenous contrast. Angiographic images of the head were obtained using MRA technique without contrast. COMPARISON:  Head CT yesterday. FINDINGS: MRI HEAD FINDINGS Brain: Small acute infarction at  the right lateral thalamus/posterior limb internal capsule. No evidence of hemorrhage or mass effect. No other acute finding. Chronic small-vessel ischemic changes affect the pons. Few old small vessel cerebellar infarctions. Cerebral hemispheres show generalized atrophy with moderate to marked chronic small-vessel ischemic changes of the white matter. No cortical or large vessel territory infarction. No mass, hemorrhage, hydrocephalus or extra-axial collection. Vascular: Major vessels at the base of the brain show flow. Skull and upper cervical spine: Negative Sinuses/Orbits: Clear/normal Other: None MRA HEAD FINDINGS Both internal carotid arteries are widely patent into the brain. No siphon stenosis. The anterior and middle cerebral vessels are patent without proximal stenosis, aneurysm or vascular malformation. Both vertebral arteries are widely patent to the basilar. No basilar stenosis. Posterior circulation branch vessels appear normal. IMPRESSION: 1. Small acute infarction at the right lateral thalamus/posterior limb internal capsule. No hemorrhage or mass effect. 2. Chronic small-vessel ischemic changes elsewhere throughout the brain  as outlined above. Generalized brain atrophy. 3. Normal intracranial MR angiography of the large and medium size vessels. Electronically Signed   By: Paulina Fusi M.D.   On: 04/07/2020 11:34   MR BRAIN WO CONTRAST  Result Date: 04/07/2020 CLINICAL DATA:  Acute onset of left-sided weakness. EXAM: MRI HEAD WITHOUT CONTRAST MRA HEAD WITHOUT CONTRAST TECHNIQUE: Multiplanar, multiecho pulse sequences of the brain and surrounding structures were obtained without intravenous contrast. Angiographic images of the head were obtained using MRA technique without contrast. COMPARISON:  Head CT yesterday. FINDINGS: MRI HEAD FINDINGS Brain: Small acute infarction at the right lateral thalamus/posterior limb internal capsule. No evidence of hemorrhage or mass effect. No other acute finding. Chronic small-vessel ischemic changes affect the pons. Few old small vessel cerebellar infarctions. Cerebral hemispheres show generalized atrophy with moderate to marked chronic small-vessel ischemic changes of the white matter. No cortical or large vessel territory infarction. No mass, hemorrhage, hydrocephalus or extra-axial collection. Vascular: Major vessels at the base of the brain show flow. Skull and upper cervical spine: Negative Sinuses/Orbits: Clear/normal Other: None MRA HEAD FINDINGS Both internal carotid arteries are widely patent into the brain. No siphon stenosis. The anterior and middle cerebral vessels are patent without proximal stenosis, aneurysm or vascular malformation. Both vertebral arteries are widely patent to the basilar. No basilar stenosis. Posterior circulation branch vessels appear normal. IMPRESSION: 1. Small acute infarction at the right lateral thalamus/posterior limb internal capsule. No hemorrhage or mass effect. 2. Chronic small-vessel ischemic changes elsewhere throughout the brain as outlined above. Generalized brain atrophy. 3. Normal intracranial MR angiography of the large and medium size vessels.  Electronically Signed   By: Paulina Fusi M.D.   On: 04/07/2020 11:34   ECHOCARDIOGRAM COMPLETE  Result Date: 04/06/2020    ECHOCARDIOGRAM REPORT   Patient Name:   Shelley Sloan Date of Exam: 04/06/2020 Medical Rec #:  [id number]        Height:       63.0 in Accession #:    [id number]       Weight:       126.5 lb Date of Birth:  13-Nov-1923        BSA:          1.592 m Patient Age:    89 years         BP:           156/54 mmHg Patient Gender: F                HR:  63 bpm. Exam Location:  Inpatient Procedure: 2D Echo Indications:    Stroke I63.9  History:        Patient has no prior history of Echocardiogram examinations.                 Risk Factors:Hypertension.  Sonographer:    Thurman Coyer RDCS (AE) Referring Phys: [id number] ASHISH ARORA IMPRESSIONS  1. Left ventricular ejection fraction, by estimation, is 60 to 65%. The left ventricle has normal function. The left ventricle has no regional wall motion abnormalities. There is mild asymmetric left ventricular hypertrophy of the basal and septal segments. Left ventricular diastolic parameters were normal.  2. Right ventricular systolic function is normal. The right ventricular size is normal. There is normal pulmonary artery systolic pressure.  3. The mitral valve is normal in structure. No evidence of mitral valve regurgitation. No evidence of mitral stenosis.  4. The aortic valve is tricuspid. There is mild calcification of the aortic valve. Aortic valve regurgitation is not visualized. Mild aortic valve sclerosis is present, with no evidence of aortic valve stenosis.  5. The inferior vena cava is normal in size with greater than 50% respiratory variability, suggesting right atrial pressure of 3 mmHg. FINDINGS  Left Ventricle: Left ventricular ejection fraction, by estimation, is 60 to 65%. The left ventricle has normal function. The left ventricle has no regional wall motion abnormalities. The left ventricular internal cavity size was normal in  size. There is  mild asymmetric left ventricular hypertrophy of the basal and septal segments. Left ventricular diastolic parameters were normal. Right Ventricle: The right ventricular size is normal. No increase in right ventricular wall thickness. Right ventricular systolic function is normal. There is normal pulmonary artery systolic pressure. The tricuspid regurgitant velocity is 2.16 m/s, and  with an assumed right atrial pressure of 3 mmHg, the estimated right ventricular systolic pressure is 21.7 mmHg. Left Atrium: Left atrial size was normal in size. Right Atrium: Right atrial size was normal in size. Pericardium: There is no evidence of pericardial effusion. Mitral Valve: The mitral valve is normal in structure. No evidence of mitral valve regurgitation. No evidence of mitral valve stenosis. Tricuspid Valve: The tricuspid valve is normal in structure. Tricuspid valve regurgitation is mild . No evidence of tricuspid stenosis. Aortic Valve: The aortic valve is tricuspid. There is mild calcification of the aortic valve. Aortic valve regurgitation is not visualized. Aortic regurgitation PHT measures 365 msec. Mild aortic valve sclerosis is present, with no evidence of aortic valve stenosis. Pulmonic Valve: The pulmonic valve was normal in structure. Pulmonic valve regurgitation is not visualized. No evidence of pulmonic stenosis. Aorta: The aortic root is normal in size and structure. Venous: The inferior vena cava is normal in size with greater than 50% respiratory variability, suggesting right atrial pressure of 3 mmHg. IAS/Shunts: No atrial level shunt detected by color flow Doppler.  LEFT VENTRICLE PLAX 2D LVIDd:         3.60 cm  Diastology LVIDs:         2.20 cm  LV e' medial:    7.51 cm/s LV PW:         1.10 cm  LV E/e' medial:  12.1 LV IVS:        1.20 cm  LV e' lateral:   7.62 cm/s LVOT diam:     2.10 cm  LV E/e' lateral: 11.9 LV SV:         80 LV SV Index:   50 LVOT Area:  3.46 cm  RIGHT VENTRICLE  RV S prime:     12.40 cm/s TAPSE (M-mode): 2.2 cm LEFT ATRIUM             Index       RIGHT ATRIUM           Index LA diam:        3.20 cm 2.01 cm/m  RA Area:     15.20 cm LA Vol (A2C):   27.5 ml 17.28 ml/m RA Volume:   36.90 ml  23.18 ml/m LA Vol (A4C):   32.4 ml 20.35 ml/m LA Biplane Vol: 30.5 ml 19.16 ml/m  AORTIC VALVE LVOT Vmax:   85.80 cm/s LVOT Vmean:  58.200 cm/s LVOT VTI:    0.232 m AI PHT:      365 msec  AORTA Ao Root diam: 3.20 cm MITRAL VALVE                TRICUSPID VALVE MV Area (PHT): 2.66 cm     TR Peak grad:   18.7 mmHg MV Decel Time: 285 msec     TR Vmax:        216.00 cm/s MV E velocity: 90.80 cm/s MV A velocity: 113.00 cm/s  SHUNTS MV E/A ratio:  0.80         Systemic VTI:  0.23 m                             Systemic Diam: 2.10 cm Charlton Haws MD Electronically signed by Charlton Haws MD Signature Date/Time: 04/06/2020/2:05:07 PM    Final    CT HEAD CODE STROKE WO CONTRAST  Result Date: 04/06/2020 CLINICAL DATA:  Code stroke. EXAM: CT HEAD WITHOUT CONTRAST TECHNIQUE: Contiguous axial images were obtained from the base of the skull through the vertex without intravenous contrast. COMPARISON:  None. FINDINGS: Brain: There is no acute intracranial hemorrhage. Confluent areas of hypoattenuation in the supratentorial greater than pontine white matter are nonspecific probably reflect moderate to advanced chronic microvascular ischemic changes. Prominence of the ventricles and sulci reflects generalized parenchymal volume loss. Gray-white differentiation is preserved. There is no extra-axial fluid collection. Vascular: No hyperdense vessel. There is intracranial atherosclerotic calcification at the skull base. Skull: Unremarkable. Sinuses/Orbits: Minor mucosal thickening. No significant orbital abnormality. Other: Mastoid air cells are clear. ASPECTS ([place]berta Stroke Program Early CT Score) - Ganglionic level infarction (caudate, lentiform nuclei, internal capsule, insula, M1-M3 cortex): 7 -  Supraganglionic infarction (M4-M6 cortex): 3 Total score (0-10 with 10 being normal): 10 IMPRESSION: There is no acute intracranial hemorrhage or evidence of acute infarction. ASPECT score is 10. Moderate to advanced chronic microvascular ischemic changes. These results were communicated to Dr. Wilford Corner at 12:04 pm on 04/06/2020 by text page via the [facility] messaging system. Electronically Signed   By: Guadlupe Spanish M.D.   On: 04/06/2020 12:09   VAS US CAROTID  Result Date: 04/08/2020 Carotid Arterial Duplex Study Indications:       Weakness and Left side hemiparesis (arm & leg). Risk Factors:      Hypertension, past history of smoking. Comparison Study:  No previous exams Performing Technologist: Ernestene Mention  Examination Guidelines: A complete evaluation includes B-mode imaging, spectral Doppler, color Doppler, and power Doppler as needed of all accessible portions of each vessel. Bilateral testing is considered an integral part of a complete examination. Limited examinations for reoccurring indications may be performed as noted.  Right Carotid Findings: +----------+--------+--------+--------+------------------+------------------+  PSV cm/sEDV cm/sStenosisPlaque DescriptionComments           +----------+--------+--------+--------+------------------+------------------+ CCA Prox  59      13                                intimal thickening +----------+--------+--------+--------+------------------+------------------+ CCA Distal47      5                                                    +----------+--------+--------+--------+------------------+------------------+ ICA Prox  61      12                                                   +----------+--------+--------+--------+------------------+------------------+ ICA Distal61      15                                                   +----------+--------+--------+--------+------------------+------------------+ ECA       87                                                            +----------+--------+--------+--------+------------------+------------------+ +----------+--------+-------+----------------+-------------------+           PSV cm/sEDV cmsDescribe        Arm Pressure (mmHG) +----------+--------+-------+----------------+-------------------+ [id number]            Multiphasic, WNL                    +----------+--------+-------+----------------+-------------------+ +---------+--------+--+--------+-+---------+ VertebralPSV cm/s49EDV cm/s7Antegrade +---------+--------+--+--------+-+---------+  Left Carotid Findings: +----------+--------+-------+--------+----------------------+------------------+           PSV cm/sEDV    StenosisPlaque Description    Comments                             cm/s                                                    +----------+--------+-------+--------+----------------------+------------------+ CCA Prox  63      10                                   intimal thickening +----------+--------+-------+--------+----------------------+------------------+ CCA Distal55      11             smooth and            intimal thickening                                  heterogenous                             +----------+--------+-------+--------+----------------------+------------------+  ICA Prox  65      12                                                      +----------+--------+-------+--------+----------------------+------------------+ ICA Distal71      16                                                      +----------+--------+-------+--------+----------------------+------------------+ ECA       65                                                              +----------+--------+-------+--------+----------------------+------------------+ +----------+--------+--------+----------------+-------------------+           PSV cm/sEDV cm/sDescribe         Arm Pressure (mmHG) +----------+--------+--------+----------------+-------------------+ [id number]             Multiphasic, WNL                    +----------+--------+--------+----------------+-------------------+ +---------+--------+--+--------+--+---------+ VertebralPSV cm/s50EDV cm/s10Antegrade +---------+--------+--+--------+--+---------+   Summary: Right Carotid: The extracranial vessels were near-normal with only minimal wall                thickening or plaque. Left Carotid: The extracranial vessels were near-normal with only minimal wall               thickening or plaque. Vertebrals:  Bilateral vertebral arteries demonstrate antegrade flow. Subclavians: Normal flow hemodynamics were seen in bilateral subclavian              arteries. *See table(s) above for measurements and observations.  Electronically signed by Gretta Beganodd Early MD on 04/08/2020 at 6:05:42 AM.    Final     Assessment/Plan Primary hypertension Check BP sitting up as c/o Dizziness/Lightheadiness Loose Control right now On Norvasc 5 mg   Rash Nystatin and Hydrocortisone  H/O: CVA (cerebrovascular accident)s/p TPA Off Aspirin after 3 weeks On Plavix now Follow up with Neurology On Statin Hypothyroidism, unspecified type TSh normal in Facility Mixed hyperlipidemia LDL 96 in 4/22 Dizziness Meclizine PRN Loose Control of BP Gastroesophageal reflux disease On Prilosec Doing well    Labs/tests ordered:  * No order type specified * Next appt:  Visit date not found

## 2020-05-01 ENCOUNTER — Encounter: Payer: Self-pay | Admitting: Internal Medicine

## 2020-05-06 ENCOUNTER — Ambulatory Visit (INDEPENDENT_AMBULATORY_CARE_PROVIDER_SITE_OTHER): Payer: Medicare Other | Admitting: Adult Health

## 2020-05-06 ENCOUNTER — Other Ambulatory Visit: Payer: Self-pay

## 2020-05-06 ENCOUNTER — Encounter: Payer: Self-pay | Admitting: Adult Health

## 2020-05-06 VITALS — BP 153/68 | HR 72 | Ht 63.0 in | Wt 125.0 lb

## 2020-05-06 DIAGNOSIS — R269 Unspecified abnormalities of gait and mobility: Secondary | ICD-10-CM

## 2020-05-06 DIAGNOSIS — I10 Essential (primary) hypertension: Secondary | ICD-10-CM

## 2020-05-06 DIAGNOSIS — I69398 Other sequelae of cerebral infarction: Secondary | ICD-10-CM | POA: Diagnosis not present

## 2020-05-06 DIAGNOSIS — E785 Hyperlipidemia, unspecified: Secondary | ICD-10-CM | POA: Diagnosis not present

## 2020-05-06 DIAGNOSIS — I639 Cerebral infarction, unspecified: Secondary | ICD-10-CM | POA: Diagnosis not present

## 2020-05-06 NOTE — Progress Notes (Signed)
[facility] [address] [place]. [place] [address] [phone]       HOSPITAL FOLLOW UP NOTE  Ms. GRAYCE BUDDEN Date of Birth:  11-May-1981 Medical Record Number:  [id number]   Reason for Referral:  hospital stroke follow up    SUBJECTIVE:   CHIEF COMPLAINT:  Chief Complaint  Patient presents with  . Follow-up    TR with son (wiley) Pt is well, pretty independent. No symptoms or complications       HPI:   Palma F Sykesis a 87 y.o.femalepast medical history of hypertension, arthritis, living at an assisted living facility, last known well at 9 AM on 04/06/2020 when she woke up and went to the bathroom without any problems, went back to take a nap and at around 10 AM noticed that her left side was weaker.  Personally reviewed hospitalization pertinent progress notes, lab work and imaging with summary provided.  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  Recommended DAPT for 3 weeks and Plavix alone.  HTN on amlodipine 5 mg daily increase to 10 mg daily due to elevated BPs.  LDL 96 and initiate atorvastatin 20 mg daily.  Other stroke risk factors include advanced age but no prior stroke history.  Residual deficit of mild dysmetria on left hand and therapy recommended discharge to SNF at [facility] where she currently resides in ALF.   Stroke -Acute infarct of R thalamus /PLIC s/p tPA, likely small vessel disease  CT head No acute abnormality.  MRISmall acute infarction at the right lateral thalamus/posterior limb internal capsule. No hemorrhage or mass effect.  MRA head normal intracranial MR angiography of the large and medium size vessels.  CUSunremarkable  2D echo EF 60 to 65%  LDL96  HgbA1c5.8  VTE prophylaxis -Lovenox  aspirin 81 mg dailyprior to admission,now onASA + Plavix for 3 weeks and the Plavix alone  Therapy  recommendations:SNF  Disposition:SNF  Today, 85/04/2020, Ms. Pellegrino is being seen for hospital follow-up accompanied by her son, Paris Lore.   She has recovered well and denies residual deficits Working with PT for gait and generalized strengthening. Using a RW for ambulation - previously using a cane. She continues to reside in skilled nursing at [facility] with goals of returning back to her apartment in the independent living section in the near future. Denies new stroke/TIA symptoms  Completed 3 weeks DAPT and remains on Plavix alone without associated side effects Continues on atorvastatin 20 mg daily without associated side effects Blood pressure today 153/68 - monitors at facility which has varied - typically 130-150s   No further concerns at this time     ROS:   14 system review of systems performed and negative with exception of those listed in HPI  PMH:  Past Medical History:  Diagnosis Date  . Arthritis   . Hypertension     PSH:  Past Surgical History:  Procedure Laterality Date  . ABDOMINAL HYSTERECTOMY    . JOINT REPLACEMENT Right 1995  . JOINT REPLACEMENT Left 1997    Social History:  Social History   Socioeconomic History  . Marital status: Married    Spouse name: Not on file  . Number of children: Not on file  . Years of education: Not on file  . Highest education level: Not on file  Occupational History  . Not on file  Tobacco Use  . Smoking status: Former Smoker    Packs/day: 0.50  Years: 18.00    Pack years: 9.00    Quit date: 37    Years since quitting: 47.3  . Smokeless tobacco: Never Used  Substance and Sexual Activity  . Alcohol use: Yes  . Drug use: Never  . Sexual activity: Not on file  Other Topics Concern  . Not on file  Social History Narrative  . Not on file   Social Determinants of Health   Financial Resource Strain: Not on file  Food Insecurity: Not on file  Transportation Needs: Not on file  Physical Activity: Not  on file  Stress: Not on file  Social Connections: Not on file  Intimate Partner Violence: Not on file    Family History: History reviewed. No pertinent family history.  Medications:   Current Outpatient Medications on File Prior to Visit  Medication Sig Dispense Refill  . acetaminophen (TYLENOL) 325 MG tablet Take 650 mg by mouth every 6 (six) hours as needed.    Marland Kitchen albuterol (VENTOLIN HFA) 108 (90 Base) MCG/ACT inhaler Inhale into the lungs every 6 (six) hours as needed for wheezing or shortness of breath.    Marland Kitchen amLODipine (NORVASC) 10 MG tablet Take 0.5 tablets (5 mg total) by mouth daily. 30 tablet 0  . aspirin 81 MG chewable tablet Chew by mouth daily.    Marland Kitchen atorvastatin (LIPITOR) 10 MG tablet Take 2 tablets (20 mg total) by mouth daily. 60 tablet 1  . cetirizine (ZYRTEC) 10 MG tablet Take 10 mg by mouth daily.    . cholecalciferol (VITAMIN D3) 25 MCG (1000 UNIT) tablet Take 1,000 Units by mouth daily.    . clopidogrel (PLAVIX) 75 MG tablet Take 1 tablet (75 mg total) by mouth daily. 30 tablet 0  . hydrocortisone cream 1 % Apply 1 application topically 2 (two) times daily.    Marland Kitchen levothyroxine (SYNTHROID) 50 MCG tablet Take 50 mcg by mouth daily.    . meclizine (ANTIVERT) 12.5 MG tablet Take 1 tablet (12.5 mg total) by mouth 3 (three) times daily as needed for dizziness. 30 tablet 0  . nystatin cream (MYCOSTATIN) Apply 1 application topically 2 (two) times daily.    Marland Kitchen omeprazole (PRILOSEC) 20 MG capsule Take 20 mg by mouth daily.    Marland Kitchen tuberculin (TUBERSOL) 5 UNIT/0.1ML injection Inject into the skin once.    . zinc oxide 20 % ointment Apply 1 application topically as needed for irritation.     No current facility-administered medications on file prior to visit.    Allergies:   Allergies  Allergen Reactions  . Sulfa Antibiotics Hives      OBJECTIVE:  Physical Exam  Vitals:   05/06/20 0958  BP: (!) 153/68  Pulse: 72  Weight: 125 lb (56.7 kg)  Height: 5\' 3"  (1.6 m)   Body  mass index is 22.14 kg/m. No exam data present  Post stroke PHQ 2/9 Depression screen PHQ 2/9 05/06/2020  Decreased Interest 0  Down, Depressed, Hopeless 0  PHQ - 2 Score 0     General: Frail very pleasant elderly Caucasian female, seated, in no evident distress Head: head normocephalic and atraumatic.   Neck: supple with no carotid or supraclavicular bruits Cardiovascular: regular rate and rhythm, no murmurs Musculoskeletal: no deformity Skin:  no rash/petichiae Vascular:  Normal pulses all extremities   Neurologic Exam Mental Status: Awake and fully alert.   Fluent speech and language.  Oriented to place and time. Recent and remote memory intact. Attention span, concentration and fund of knowledge appropriate. Mood  and affect appropriate.  Cranial Nerves: Fundoscopic exam reveals sharp disc margins. Pupils equal, briskly reactive to light. Extraocular movements full without nystagmus. Visual fields full to confrontation. Hearing intact. Facial sensation intact. Face, tongue, palate moves normally and symmetrically.  Motor: Normal bulk and tone. Normal strength in all tested extremity muscles except mild left grip weakness and mild LE hip flexor weakness Sensory.: intact to touch , pinprick , position and vibratory sensation.  Coordination: Rapid alternating movements normal in all extremities except slightly decreased left hand. Finger-to-nose and heel-to-shin performed accurately bilaterally. Gait and Station: Deferred as rolling walker not present during visit Reflexes: 1+ and symmetric. Toes downgoing.     NIHSS  0 Modified Rankin  2-3      ASSESSMENT: DEAUN ROCHA is a 87 y.o. year old female presented with left-sided weakness on 04/06/2020 with stroke work-up revealing acute infarct of right thalamus/PLIC sp tPA likely secondary to small vessel disease. Vascular risk factors include HTN, HLD and advanced age.      PLAN:  1. R thalamic/PLIC stroke :  a. Residual  deficit: Gait impairment with imbalance and mild decrease left hand dexterity.  Overall making great improvement and recommend continued participation with PT at SNF with goals of transitioning back to independent living at [facility] b. Continue clopidogrel 75 mg daily  and atorvastatin 20 mg daily for secondary stroke prevention.  c. Discussed secondary stroke prevention measures and importance of close PCP follow up for aggressive stroke risk factor management  2. HTN: BP goal <130/90.  Slightly elevated today on amlodipine 10 mg daily per PCP 3. HLD: LDL goal <70. Recent LDL 96 -initiated atorvastatin 20 mg daily during recent stroke admission.  Request facility/PCP obtain lipid panels in the next 1 to 2 months as well as ongoing prescribing of atorvastatin    Follow up in 3 months or call earlier if needed   CC:  [facility] provider: Dr. Pearlean Brownie PCP: Charlane Ferretti, DO    I spent 46 minutes of face-to-face and non-face-to-face time with patient and son.  This included extensive review of his hospitalization and prolonged discussion with the patient and son regarding recent stroke and likely etiology, residual stroke deficits and typical recovery time and importance of continued therapy, secondary stroke prevention education with prescribed medications and managing events factors and answered all other questions to patient and son satisfaction  Ihor Austin, AGNP-BC  [facility] [address] [place], [place] [address]  Phone [phone] Fax [phone] Note: This document was prepared with digital dictation and possible smart phrase technology. Any transcriptional errors that result from this process are unintentional.

## 2020-05-06 NOTE — Patient Instructions (Signed)
Continue working with physical therapy for likely on going recovery  Continue clopidogrel 75 mg daily  and atorvastatin  for secondary stroke prevention  Continue to follow up with PCP regarding cholesterol and blood pressure management  Maintain strict control of hypertension with blood pressure goal below 130/90 and cholesterol with LDL cholesterol (bad cholesterol) goal below 70 mg/dL.       Followup in the future with me in 3 months or call earlier if needed       Thank you for coming to see [place] at [facility]. I hope we have been able to provide you high quality care today.  You may receive a patient satisfaction survey over the next few weeks. We would appreciate your feedback and comments so that we may continue to improve ourselves and the health of our patients.

## 2020-05-06 NOTE — Progress Notes (Signed)
I agree with the above plan 

## 2020-05-13 ENCOUNTER — Encounter: Payer: Self-pay | Admitting: Orthopedic Surgery

## 2020-05-13 ENCOUNTER — Non-Acute Institutional Stay (SKILLED_NURSING_FACILITY): Payer: Medicare Other | Admitting: Orthopedic Surgery

## 2020-05-13 DIAGNOSIS — R059 Cough, unspecified: Secondary | ICD-10-CM | POA: Diagnosis not present

## 2020-05-13 DIAGNOSIS — U071 COVID-19: Secondary | ICD-10-CM | POA: Diagnosis not present

## 2020-05-13 MED ORDER — GUAIFENESIN ER 600 MG PO TB12: 600.0000 mg | ORAL_TABLET | Freq: Two times a day (BID) | ORAL | 0 refills | Status: AC

## 2020-05-13 NOTE — Progress Notes (Addendum)
Location:   [facility] Room Number: N32 Place of Service:  SNF [phone]) Provider:  Hazle Nordmann, NP    Patient Care Team: Charlane Ferretti, DO as PCP - General (Internal Medicine)  Extended Emergency Contact Information Primary Emergency Contact: Elease Etienne [place] Mobile Phone: [phone] Relation: Son Secondary Emergency Contact: Fickling,Jeffrey Mobile Phone: [phone] Relation: Son  Code Status: FULL CODE   Goals of care: Advanced Directive information Advanced Directives 05/13/2020  Does Patient Have a Medical Advance Directive? Yes  Type of Advance Directive Healthcare Power of Attorney  Does patient want to make changes to medical advance directive? No - Patient declined  Copy of Healthcare Power of Attorney in Chart? Yes - validated most recent copy scanned in chart (See row information)     Chief Complaint  Patient presents with  . Acute Visit    Patient complains of cough. Positive Covid testing.     HPI:  Pt is a 87 y.o. female seen today for an acute visit for cough.   05/09 she tested positive for Covid-19. Initially she c/o of a sore throat. Remains afebrile. She has developed a cough within the past 24 hours. Cough with clear sputum production, described as "hoarse cough." She denies sob, or any other cold symptoms.Treatment options discussed with patient including antiviral therapy. She would like CXR results prior to starting a antiviral. Vitamin C, vitamin D and zinc started once she tested positive. She remains on isolation/droplet precautions for 10 days.  Vitals requested prior to encounter/ taken by nurse: 144/80, 8/0, 97.7, 97% (room air), respirations 16     Past Medical History:  Diagnosis Date  . Arthritis   . Hypertension    Past Surgical History:  Procedure Laterality Date  . ABDOMINAL HYSTERECTOMY    . JOINT REPLACEMENT Right 1995  . JOINT REPLACEMENT Left 1997    Allergies  Allergen Reactions   . Sulfa Antibiotics Hives    Allergies as of 05/13/2020      Reactions   Sulfa Antibiotics Hives      Medication List       Accurate as of May 13, 2020  9:28 AM. If you have any questions, ask your nurse or doctor.        STOP taking these medications   aspirin 81 MG chewable tablet Stopped by: Octavia Heir, NP   nystatin cream Commonly known as: MYCOSTATIN Stopped by: Octavia Heir, NP   Tubersol 5 UNIT/0.1ML injection Generic drug: tuberculin Stopped by: Octavia Heir, NP     TAKE these medications   acetaminophen 325 MG tablet Commonly known as: TYLENOL Take 650 mg by mouth every 4 (four) hours as needed.   albuterol 108 (90 Base) MCG/ACT inhaler Commonly known as: VENTOLIN HFA Inhale 1 puff into the lungs as needed for wheezing or shortness of breath.   amLODipine 10 MG tablet Commonly known as: NORVASC Take 0.5 tablets (5 mg total) by mouth daily.   atorvastatin 10 MG tablet Commonly known as: LIPITOR Take 2 tablets (20 mg total) by mouth daily.   cetirizine 10 MG tablet Commonly known as: ZYRTEC Take 10 mg by mouth daily.   cholecalciferol 25 MCG (1000 UNIT) tablet Commonly known as: VITAMIN D3 Take 1,000 Units by mouth daily.   Vitamin D3 50 MCG (2000 UT) Tabs Take 1 tablet by mouth daily.   clopidogrel 75 MG tablet Commonly known as: PLAVIX Take 1 tablet (75 mg total) by mouth daily.  hydrocortisone 1 % ointment Apply 1 application topically 4 (four) times daily. What changed: Another medication with the same name was removed. Continue taking this medication, and follow the directions you see here. Changed by: Octavia Heir, NP   ICAPS AREDS 2 PO Take 1 capsule by mouth 2 (two) times daily.   levothyroxine 50 MCG tablet Commonly known as: SYNTHROID Take 50 mcg by mouth daily.   meclizine 12.5 MG tablet Commonly known as: ANTIVERT Take 1 tablet (12.5 mg total) by mouth 3 (three) times daily as needed for dizziness.   omeprazole 20 MG  capsule Commonly known as: PRILOSEC Take 20 mg by mouth daily.   vitamin C 1000 MG tablet Take 1,000 mg by mouth 2 (two) times daily.   Zinc 50 MG Tabs Take 1 tablet by mouth daily.   zinc oxide 20 % ointment Apply 1 application topically as needed for irritation.       Review of Systems  Constitutional: Negative for activity change, appetite change, fatigue and fever.  HENT: Positive for sore throat. Negative for congestion and trouble swallowing.   Respiratory: Positive for cough. Negative for shortness of breath.   Cardiovascular: Negative for chest pain and leg swelling.  Skin: Negative.   Psychiatric/Behavioral: Negative for confusion and dysphoric mood. The patient is not nervous/anxious.     Immunization History  Administered Date(s) Administered  . Influenza, High Dose Seasonal PF 10/17/2018  . Moderna Sars-Covid-2 Vaccination 01/07/2019, 02/04/2019, 12/16/2019  . Pneumococcal Conjugate-13 04/04/2014  . Pneumococcal Polysaccharide-23 11/03/2001  . Tetanus 05/04/2015   Pertinent  Health Maintenance Due  Topic Date Due  . DEXA SCAN  Never done  . INFLUENZA VACCINE  08/03/2020  . PNA vac Low Risk Adult  Completed   No flowsheet data found. Functional Status Survey:    Vitals:   05/13/20 0906  BP: 129/72  Pulse: 81  Resp: 18  Temp: 97.7 F (36.5 C)  SpO2: 97%  Weight: 126 lb 14.4 oz (57.6 kg)  Height: 5\' 3"  (1.6 m)   Body mass index is 22.48 kg/m. Physical Exam Vitals reviewed.  Constitutional:      General: She is not in acute distress. HENT:     Head: Normocephalic.     Right Ear: There is no impacted cerumen.     Left Ear: There is no impacted cerumen.     Nose: Nose normal.     Mouth/Throat:     Mouth: Mucous membranes are moist.     Pharynx: No posterior oropharyngeal erythema.  Eyes:     General:        Right eye: No discharge.        Left eye: No discharge.  Cardiovascular:     Rate and Rhythm: Normal rate and regular rhythm.      Pulses: Normal pulses.     Heart sounds: Normal heart sounds.  Pulmonary:     Breath sounds: Examination of the right-middle field reveals rhonchi. Examination of the left-middle field reveals rhonchi. Rhonchi present.  Abdominal:     General: Bowel sounds are normal. There is no distension.     Palpations: Abdomen is soft.     Tenderness: There is no abdominal tenderness.  Skin:    General: Skin is warm and dry.     Capillary Refill: Capillary refill takes less than 2 seconds.  Neurological:     General: No focal deficit present.     Mental Status: She is alert and oriented to person, place, and  time.     Motor: Weakness present.     Gait: Gait abnormal.     Comments: walker  Psychiatric:        Mood and Affect: Mood normal.        Behavior: Behavior normal.     Labs reviewed: Recent Labs    10/10/19 0843 04/06/20 1150 04/06/20 1159  NA 140 140 140  K 3.4* 3.9 4.0  CL 104 106 107  CO2 26 25  --   GLUCOSE 118* 104* 102*  BUN 15 20 28*  CREATININE 0.60 0.61 0.60  CALCIUM 8.8* 9.0  --    Recent Labs    10/10/19 0843 04/06/20 1150  AST 23 29  ALT 11 9  ALKPHOS 84 78  BILITOT 0.8 1.0  PROT 7.0 6.6  ALBUMIN 3.9 3.5   Recent Labs    10/10/19 0843 04/06/20 1150 04/06/20 1159  WBC 5.4 5.7  --   NEUTROABS 4.4 3.4  --   HGB 12.3 13.1 12.9  HCT 37.8 41.2 38.0  MCV 93.8 94.5  --   PLT 220 242  --    No results found for: TSH Lab Results  Component Value Date   HGBA1C 5.8 (H) 04/07/2020   Lab Results  Component Value Date   CHOL 170 04/07/2020   HDL 58 04/07/2020   LDLCALC 96 04/07/2020   TRIG 80 04/07/2020   CHOLHDL 2.9 04/07/2020    Significant Diagnostic Results in last 30 days:  No results found.  Assessment/Plan 1. COVID-19 - positive 05/09 - afebrile, sore throat, cough within last 24 hours - chest xray - mucinex 600 mg po bid x 10 days - paxlovid- pharmacy to dose - recommend warm fluids for sore throat  2. Cough - same as  above    Family/ staff Communication: plan discussed with patient and nurse  Labs/tests ordered: CXR

## 2020-05-20 ENCOUNTER — Non-Acute Institutional Stay (SKILLED_NURSING_FACILITY): Payer: Medicare Other | Admitting: Orthopedic Surgery

## 2020-05-20 ENCOUNTER — Encounter: Payer: Self-pay | Admitting: Orthopedic Surgery

## 2020-05-20 DIAGNOSIS — E782 Mixed hyperlipidemia: Secondary | ICD-10-CM

## 2020-05-20 DIAGNOSIS — E039 Hypothyroidism, unspecified: Secondary | ICD-10-CM

## 2020-05-20 DIAGNOSIS — U071 COVID-19: Secondary | ICD-10-CM

## 2020-05-20 DIAGNOSIS — Z8673 Personal history of transient ischemic attack (TIA), and cerebral infarction without residual deficits: Secondary | ICD-10-CM

## 2020-05-20 DIAGNOSIS — R42 Dizziness and giddiness: Secondary | ICD-10-CM

## 2020-05-20 DIAGNOSIS — I10 Essential (primary) hypertension: Secondary | ICD-10-CM | POA: Diagnosis not present

## 2020-05-20 DIAGNOSIS — K219 Gastro-esophageal reflux disease without esophagitis: Secondary | ICD-10-CM

## 2020-05-20 DIAGNOSIS — R7303 Prediabetes: Secondary | ICD-10-CM

## 2020-05-20 NOTE — Progress Notes (Signed)
Location:   [facility] Room Number: N32 Place of Service:  SNF [phone]) Provider:  Hazle Nordmann, NP    Patient Care Team: Charlane Ferretti, DO as PCP - General (Internal Medicine)  Extended Emergency Contact Information Primary Emergency Contact: Elease Etienne [place] of [place] Mobile Phone: [phone] Relation: Son Secondary Emergency Contact: Monts,Jeffrey Mobile Phone: [phone] Relation: Son  Code Status:  FULL CODE Goals of care: Advanced Directive information Advanced Directives 05/20/2020  Does Patient Have a Medical Advance Directive? -  Type of Estate agent of [place];Living will  Does patient want to make changes to medical advance directive? No - Patient declined  Copy of Healthcare Power of Attorney in Chart? Yes - validated most recent copy scanned in chart (See row information)     Chief Complaint  Patient presents with  . Medical Management of Chronic Issues    Routine follow up.   Marland Kitchen Health Maintenance    Discuss need for DEXA scan.    HPI:  Pt is a 87 y.o. female seen today for medical management of chronic diseases.    She is a resident on the skilled nursing unit at [facility]. Past medical history includes: ischemic stoke, hypertension, seasonal allergies, GERD, hypothyroidism, hearing loss, and abnormal gait.   Covid-19- tested positive 05/09. Paxlovid completed. CXR negative. Droplet isolation discontinued today. She denies any cold symptoms or sob.   Ischemic stroke- hospitalized 04/04- 04/07. Given TPA during hospitalization. She completed 3 weeks of asa + Plavix. Cont Plavix and statin. She lived [place] prior to event. Discharge meeting scheduled tomorrow with son.   Left sides weakness- ambulates short distances with FWW. Left knee painful with ambulation. Knee brace has been ordered per PT. No recent falls or injuries.   HTN- takes Norvasc since 04/07 Hypothyroidism- TSH 2.62 04/16/2020-  cont levothyroxine Hyperlipidemia- LDL 96 04/07/2020 Prediabetes- A1c 5.8 04/07/2020 Seasonal allergies- no recent increase in severity, takes zyrtec Dizziness- no recent increase in severity, cont prn meclizine GERD- takes Prilosec daily  Recent weights are as follows:  05/04- 126.9 lbs  04/27- 125.9 lbs  04/13- 125.2 lbs   Recent blood pressures are as follows:  05/17- 130/60  05/11- 127/65  05/10- 129/72  05/05- 136/63  Nurse does not report any concerns, vitals stable.         Past Medical History:  Diagnosis Date  . Arthritis   . Hypertension    Past Surgical History:  Procedure Laterality Date  . ABDOMINAL HYSTERECTOMY    . JOINT REPLACEMENT Right 1995  . JOINT REPLACEMENT Left 1997    Allergies  Allergen Reactions  . Sulfa Antibiotics Hives    Allergies as of 05/20/2020      Reactions   Sulfa Antibiotics Hives      Medication List       Accurate as of May 20, 2020  9:49 AM. If you have any questions, ask your nurse or doctor.        acetaminophen 325 MG tablet Commonly known as: TYLENOL Take 650 mg by mouth every 4 (four) hours as needed.   albuterol 108 (90 Base) MCG/ACT inhaler Commonly known as: VENTOLIN HFA Inhale 1 puff into the lungs as needed for wheezing or shortness of breath.   amLODipine 10 MG tablet Commonly known as: NORVASC Take 0.5 tablets (5 mg total) by mouth daily.   atorvastatin 10 MG tablet Commonly known as: LIPITOR Take 2 tablets (20 mg total) by mouth daily.  cetirizine 10 MG tablet Commonly known as: ZYRTEC Take 10 mg by mouth daily.   cholecalciferol 25 MCG (1000 UNIT) tablet Commonly known as: VITAMIN D3 Take 1,000 Units by mouth daily.   Vitamin D3 50 MCG (2000 UT) Tabs Take 1 tablet by mouth daily.   clopidogrel 75 MG tablet Commonly known as: PLAVIX Take 1 tablet (75 mg total) by mouth daily.   guaiFENesin 600 MG 12 hr tablet Commonly known as: Mucinex Take 1 tablet (600 mg total) by mouth 2  (two) times daily for 10 days.   hydrocortisone 1 % ointment Apply 1 application topically 4 (four) times daily.   ICAPS AREDS 2 PO Take 1 capsule by mouth 2 (two) times daily.   levothyroxine 50 MCG tablet Commonly known as: SYNTHROID Take 50 mcg by mouth daily.   meclizine 12.5 MG tablet Commonly known as: ANTIVERT Take 1 tablet (12.5 mg total) by mouth 3 (three) times daily as needed for dizziness.   omeprazole 20 MG capsule Commonly known as: PRILOSEC Take 20 mg by mouth daily.   zinc oxide 20 % ointment Apply 1 application topically as needed for irritation.       Review of Systems  Constitutional: Negative for activity change, appetite change, fatigue and fever.  HENT: Positive for congestion and sneezing. Negative for sore throat and trouble swallowing.        Hearing aids  Eyes: Negative for visual disturbance.       Glasses  Respiratory: Negative for cough, shortness of breath and wheezing.   Cardiovascular: Negative for chest pain and leg swelling.  Gastrointestinal: Negative for abdominal distention, abdominal pain, constipation, diarrhea and nausea.  Genitourinary: Negative for dysuria, frequency and hematuria.  Musculoskeletal: Positive for arthralgias and myalgias.       Left knee pain  Skin: Negative.   Neurological: Positive for weakness. Negative for dizziness and headaches.  Psychiatric/Behavioral: Negative for dysphoric mood. The patient is not nervous/anxious.     Immunization History  Administered Date(s) Administered  . Influenza, High Dose Seasonal PF 10/17/2018  . Moderna Sars-Covid-2 Vaccination 01/07/2019, 02/04/2019, 12/16/2019  . Pneumococcal Conjugate-13 04/04/2014  . Pneumococcal Polysaccharide-23 11/03/2001  . Tetanus 05/04/2015   Pertinent  Health Maintenance Due  Topic Date Due  . DEXA SCAN  Never done  . INFLUENZA VACCINE  08/03/2020  . PNA vac Low Risk Adult  Completed   No flowsheet data found. Functional Status Survey:     Vitals:   05/20/20 0943  BP: 130/60  Pulse: 76  Resp: 18  Temp: 98 F (36.7 C)  SpO2: 99%  Weight: 126 lb 14.4 oz (57.6 kg)  Height: 5\' 3"  (1.6 m)   Body mass index is 22.48 kg/m. Physical Exam Vitals reviewed.  Constitutional:      General: She is not in acute distress. HENT:     Head: Normocephalic.     Right Ear: There is no impacted cerumen.     Left Ear: There is no impacted cerumen.     Nose: Nose normal.     Mouth/Throat:     Mouth: Mucous membranes are moist.  Eyes:     General:        Right eye: No discharge.        Left eye: No discharge.     Extraocular Movements: Extraocular movements intact.     Pupils: Pupils are equal, round, and reactive to light.  Neck:     Vascular: No carotid bruit.  Cardiovascular:  Rate and Rhythm: Normal rate and regular rhythm.     Pulses: Normal pulses.     Heart sounds: Normal heart sounds. No murmur heard.   Pulmonary:     Effort: Pulmonary effort is normal. No respiratory distress.     Breath sounds: Normal breath sounds. No wheezing.  Abdominal:     General: Bowel sounds are normal. There is no distension.     Palpations: Abdomen is soft.     Tenderness: There is no abdominal tenderness.  Musculoskeletal:     Cervical back: Normal range of motion.     Right lower leg: No edema.     Left lower leg: No edema.  Lymphadenopathy:     Cervical: No cervical adenopathy.  Skin:    General: Skin is warm and dry.     Capillary Refill: Capillary refill takes less than 2 seconds.  Neurological:     General: No focal deficit present.     Mental Status: She is alert and oriented to person, place, and time.     Motor: Weakness present.     Gait: Gait abnormal.     Comments: Wheelchair/walker  Psychiatric:        Mood and Affect: Mood normal.        Behavior: Behavior normal.     Labs reviewed: Recent Labs    10/10/19 0843 04/06/20 1150 04/06/20 1159 04/16/20 1334  NA 140 140 140 141  K 3.4* 3.9 4.0 3.8  CL  104 106 107 105  CO2 26 25  --  27*  GLUCOSE 118* 104* 102*  --   BUN 15 20 28* 18  CREATININE 0.60 0.61 0.60 0.5  CALCIUM 8.8* 9.0  --  8.8   Recent Labs    10/10/19 0843 04/06/20 1150 04/16/20 1334  AST 23 29 18   ALT 11 9 11   ALKPHOS 84 78 92  BILITOT 0.8 1.0  --   PROT 7.0 6.6  --   ALBUMIN 3.9 3.5 3.5   Recent Labs    10/10/19 0843 04/06/20 1150 04/06/20 1159 04/16/20 1334  WBC 5.4 5.7  --  5.5  NEUTROABS 4.4 3.4  --  3,053.00  HGB 12.3 13.1 12.9 11.6*  HCT 37.8 41.2 38.0 36  MCV 93.8 94.5  --   --   PLT 220 242  --  297   Lab Results  Component Value Date   TSH 2.62 04/16/2020   Lab Results  Component Value Date   HGBA1C 5.8 (H) 04/07/2020   Lab Results  Component Value Date   CHOL 170 04/07/2020   HDL 58 04/07/2020   LDLCALC 96 04/07/2020   TRIG 80 04/07/2020   CHOLHDL 2.9 04/07/2020    Significant Diagnostic Results in last 30 days:  No results found.  Assessment/Plan 1. COVID-19 - positive test 05/09 - mild symptoms of sore throat and cough - paxlovid series complete - droplet precautions d/c today  2. H/O: CVA (cerebrovascular accident) - last event April 2022- TPA given - cont plavix and statin therapy - neuro f/u 08/13/2020  3. Primary hypertension - controlled with norvasc - cont low sodium diet  4. Hypothyroidism, unspecified type - TSH 2.62 - cont levothyroxine   5. Gastroesophageal reflux disease, unspecified whether esophagitis present - stable with Prilosec  6. Dizziness - no recent episodes - cont prn meclizine  7. Mixed hyperlipidemia - LDL 96 - cont statin  8. Prediabetes -A1c 5.8 - recommend low carb diet and limit sugar intake  Family/ staff Communication: plan discussed with patient and nurse  Labs/tests ordered:  none

## 2020-05-22 ENCOUNTER — Non-Acute Institutional Stay (SKILLED_NURSING_FACILITY): Payer: Medicare Other | Admitting: Orthopedic Surgery

## 2020-05-22 ENCOUNTER — Encounter: Payer: Self-pay | Admitting: Orthopedic Surgery

## 2020-05-22 DIAGNOSIS — E039 Hypothyroidism, unspecified: Secondary | ICD-10-CM

## 2020-05-22 DIAGNOSIS — K219 Gastro-esophageal reflux disease without esophagitis: Secondary | ICD-10-CM

## 2020-05-22 DIAGNOSIS — R7303 Prediabetes: Secondary | ICD-10-CM

## 2020-05-22 DIAGNOSIS — I10 Essential (primary) hypertension: Secondary | ICD-10-CM

## 2020-05-22 DIAGNOSIS — Z8673 Personal history of transient ischemic attack (TIA), and cerebral infarction without residual deficits: Secondary | ICD-10-CM

## 2020-05-22 DIAGNOSIS — U071 COVID-19: Secondary | ICD-10-CM

## 2020-05-22 DIAGNOSIS — E782 Mixed hyperlipidemia: Secondary | ICD-10-CM

## 2020-05-22 NOTE — Progress Notes (Signed)
Location:   [facility] Room Number: N32 Place of Service:  SNF (31)  Provider: Hazle Nordmann, NP  PCP: Octavia Heir, NP Patient Care Team: Octavia Heir, NP as PCP - General (Adult Health Nurse Practitioner)  Extended Emergency Contact Information Primary Emergency Contact: Eustaquio Boyden of [place] Mobile Phone: [phone] Relation: Son Secondary Emergency Contact: Winiecki,Jeffrey Mobile Phone: [phone] Relation: Son  Code Status: FULL CODE Goals of care:  Advanced Directive information Advanced Directives 05/22/2020  Does Patient Have a Medical Advance Directive? Yes  Type of Estate agent of [place];Living will  Does patient want to make changes to medical advance directive? No - Patient declined  Copy of Healthcare Power of Attorney in Chart? Yes - validated most recent copy scanned in chart (See row information)     Allergies  Allergen Reactions  . Sulfa Antibiotics Hives    Chief Complaint  Patient presents with  . Discharge Note    Discharge.    HPI:  87 y.o. female seen today for discharge evaluation.   She currently resides on the skilled nursing unit at [facility]. Past medical history includes: ischemic stoke, hypertension, seasonal allergies, GERD, hypothyroidism, hearing loss, and abnormal gait.   She was hospitalized at [facility] 04/04-04/07 due to stroke. CT head negative for acute abnormality. MRI brain showed small acute infarction of the right lateral thalamus/posterior limb internal capsule. She was given TPA. Aspirin and Plavix started after. Advised to take asa and Plavix for three weeks and then Plavix alone. LDL 96, statin initiated. HgbA1c 5.8. 2D echo with EF 60-65%. Amlodipine increased to 10 mg due to elevated pressures. She originally had some left sided weakness, but began to improve during her stay. She was discharged to skilled nursing for additional PT/OT.   At this time,  she can ambulate about 150 ft with walker, stand by assistance. Set up/Clean asistance for ADLs. Uses wheelchair for long distances. Some weakness noted to left knee, knee brace ordered by PT. In addition OT has noticed some additional weakness from recently having covid. Advised to continue PT/[place] in [place]. Eating 75-100% of meals. Last BM today. Denies chest pain or sob.   05/09 she tested positive for covid-19. Mild symptoms of cough and sore throat. She was started on vitamin C, vitamin D and zinc for 10 days. Droplet precautions discontinued 05/18. Today, she denies cold symptoms.   HTN- takes Norvasc since 04/07 Hypothyroidism- TSH 2.62 04/16/2020- cont levothyroxine Hyperlipidemia- LDL 96 04/07/2020 Prediabetes- A1c 5.8 04/07/2020 Seasonal allergies- no recent increase in severity, takes zyrtec Dizziness- no recent increase in severity, cont prn meclizine GERD- takes Prilosec daily  Recent weights are as follows:             05/04- 126.9 lbs             04/27- 125.9 lbs             04/13- 125.2 lbs  Recent blood pressures:  05/17- 130/60  05/11- 127/65  05/10- 129/72  At this time she plans to discharge to [place] 05/25. Home health PT/OT ordered. Family has arranged a caregiver to be with her in AM and PM first few weeks during transition.      Past Medical History:  Diagnosis Date  . Arthritis   . Hypertension     Past Surgical History:  Procedure Laterality Date  . ABDOMINAL HYSTERECTOMY    . JOINT REPLACEMENT Right 1995  .  JOINT REPLACEMENT Left 1997      reports that she quit smoking about 47 years ago. She has a 9.00 pack-year smoking history. She has never used smokeless tobacco. She reports current alcohol use. She reports that she does not use drugs. Social History   Socioeconomic History  . Marital status: Married    Spouse name: Not on file  . Number of children: Not on file  . Years of education: Not on file  . Highest education level: Not on file   Occupational History  . Not on file  Tobacco Use  . Smoking status: Former Smoker    Packs/day: 0.50    Years: 18.00    Pack years: 9.00    Quit date: 1975    Years since quitting: 47.4  . Smokeless tobacco: Never Used  Vaping Use  . Vaping Use: Never used  Substance and Sexual Activity  . Alcohol use: Yes  . Drug use: Never  . Sexual activity: Not on file  Other Topics Concern  . Not on file  Social History Narrative  . Not on file   Social Determinants of Health   Financial Resource Strain: Not on file  Food Insecurity: Not on file  Transportation Needs: Not on file  Physical Activity: Not on file  Stress: Not on file  Social Connections: Not on file  Intimate Partner Violence: Not on file   Functional Status Survey:    Allergies  Allergen Reactions  . Sulfa Antibiotics Hives    Pertinent  Health Maintenance Due  Topic Date Due  . DEXA SCAN  Never done  . INFLUENZA VACCINE  08/03/2020  . PNA vac Low Risk Adult  Completed    Medications: Allergies as of 05/22/2020      Reactions   Sulfa Antibiotics Hives      Medication List       Accurate as of May 22, 2020  2:16 PM. If you have any questions, ask your nurse or doctor.        albuterol 108 (90 Base) MCG/ACT inhaler Commonly known as: VENTOLIN HFA Inhale 1 puff into the lungs as needed for wheezing or shortness of breath.   amLODipine 10 MG tablet Commonly known as: NORVASC Take 0.5 tablets (5 mg total) by mouth daily.   atorvastatin 10 MG tablet Commonly known as: LIPITOR Take 2 tablets (20 mg total) by mouth daily.   cetirizine 10 MG tablet Commonly known as: ZYRTEC Take 10 mg by mouth daily.   cholecalciferol 25 MCG (1000 UNIT) tablet Commonly known as: VITAMIN D3 Take 1,000 Units by mouth daily.   clopidogrel 75 MG tablet Commonly known as: PLAVIX Take 1 tablet (75 mg total) by mouth daily.   guaiFENesin 600 MG 12 hr tablet Commonly known as: Mucinex Take 1 tablet (600 mg  total) by mouth 2 (two) times daily for 10 days.   hydrocortisone 1 % ointment Apply 1 application topically 4 (four) times daily.   ICAPS AREDS 2 PO Take 1 capsule by mouth 2 (two) times daily.   levothyroxine 50 MCG tablet Commonly known as: SYNTHROID Take 50 mcg by mouth daily.   meclizine 12.5 MG tablet Commonly known as: ANTIVERT Take 1 tablet (12.5 mg total) by mouth 3 (three) times daily as needed for dizziness.   omeprazole 20 MG capsule Commonly known as: PRILOSEC Take 20 mg by mouth daily.   zinc oxide 20 % ointment Apply 1 application topically as needed for irritation.  Review of Systems  Constitutional: Negative for activity change, appetite change, chills, fatigue and fever.  HENT: Negative for congestion, sore throat and trouble swallowing.   Eyes: Negative for discharge and visual disturbance.       Glasses  Respiratory: Negative for cough, shortness of breath and wheezing.   Cardiovascular: Negative for chest pain and leg swelling.  Gastrointestinal: Negative for abdominal distention, abdominal pain, constipation, diarrhea and nausea.  Genitourinary: Negative for dysuria, frequency and hematuria.  Musculoskeletal: Positive for arthralgias and myalgias.       Left knee pain  Skin: Negative.   Neurological: Positive for weakness. Negative for dizziness, numbness and headaches.  Psychiatric/Behavioral: Negative for dysphoric mood. The patient is not nervous/anxious.     Vitals:   05/22/20 1412  BP: 130/60  Pulse: 76  Resp: 18  Temp: (!) 97.4 F (36.3 C)  SpO2: 94%  Weight: 126 lb 14.4 oz (57.6 kg)  Height: 5\' 3"  (1.6 m)   Body mass index is 22.48 kg/m. Physical Exam Vitals reviewed.  Constitutional:      General: She is not in acute distress. HENT:     Head: Normocephalic.     Right Ear: There is no impacted cerumen.     Left Ear: There is no impacted cerumen.     Nose: Nose normal.     Mouth/Throat:     Mouth: Mucous membranes are  moist.     Pharynx: No posterior oropharyngeal erythema.  Eyes:     General:        Right eye: No discharge.        Left eye: No discharge.  Cardiovascular:     Rate and Rhythm: Normal rate and regular rhythm.     Pulses: Normal pulses.     Heart sounds: Normal heart sounds. No murmur heard.   Pulmonary:     Effort: Pulmonary effort is normal. No respiratory distress.     Breath sounds: Normal breath sounds. No wheezing.  Abdominal:     General: Bowel sounds are normal. There is no distension.     Palpations: Abdomen is soft.     Tenderness: There is no abdominal tenderness.  Musculoskeletal:     Cervical back: Normal range of motion.     Right lower leg: No edema.     Left lower leg: No edema.  Lymphadenopathy:     Cervical: No cervical adenopathy.  Skin:    General: Skin is warm and dry.     Capillary Refill: Capillary refill takes less than 2 seconds.  Neurological:     General: No focal deficit present.     Mental Status: She is alert and oriented to person, place, and time.     Motor: Weakness present.     Gait: Gait abnormal.     Comments: Wheelchair/walker  Psychiatric:        Mood and Affect: Mood normal.        Behavior: Behavior normal.     Labs reviewed: Basic Metabolic Panel: Recent Labs    10/10/19 0843 04/06/20 1150 04/06/20 1159 04/16/20 1334  NA 140 140 140 141  K 3.4* 3.9 4.0 3.8  CL 104 106 107 105  CO2 26 25  --  27*  GLUCOSE 118* 104* 102*  --   BUN 15 20 28* 18  CREATININE 0.60 0.61 0.60 0.5  CALCIUM 8.8* 9.0  --  8.8   Liver Function Tests: Recent Labs    10/10/19 0843 04/06/20 1150 04/16/20 1334  AST 23 29 18   ALT 11 9 11   ALKPHOS 84 78 92  BILITOT 0.8 1.0  --   PROT 7.0 6.6  --   ALBUMIN 3.9 3.5 3.5   Recent Labs    10/10/19 0843  LIPASE 38   No results for input(s): AMMONIA in the last 8760 hours. CBC: Recent Labs    10/10/19 0843 04/06/20 1150 04/06/20 1159 04/16/20 1334  WBC 5.4 5.7  --  5.5  NEUTROABS 4.4  3.4  --  3,053.00  HGB 12.3 13.1 12.9 11.6*  HCT 37.8 41.2 38.0 36  MCV 93.8 94.5  --   --   PLT 220 242  --  297   Cardiac Enzymes: No results for input(s): CKTOTAL, CKMB, CKMBINDEX, TROPONINI in the last 8760 hours. BNP: Invalid input(s): POCBNP CBG: Recent Labs    04/06/20 1840 04/07/20 0319 04/07/20 1139  GLUCAP 83 95 81    Procedures and Imaging Studies During Stay: No results found.  Assessment/Plan:   1. H/O: CVA (cerebrovascular accident) - some left knee weakness - cont plavix and statin daily - neuro f/u 08/13/2020  2. Primary hypertension - controlled with amlodipine 10 mg daily - cbc/diff- future -cmp- future  3. Hypothyroidism, unspecified type - TSH 2.62 - cont levothyroxine  4. Gastroesophageal reflux disease, unspecified whether esophagitis present - stable with Prilosec  5. Mixed hyperlipidemia -LDL 96 - cont lipitor 10 mg daily - lipid panel- future  6. Prediabetes - a1c 5.8 - cont low carb diet and limit sugars - hgb A1c- future  7. COVID-19 - resolved - tested positive 05/11/2020    Patient is being discharged with the following home health services: PT/OT/Aide   Patient is being discharged with the following durable medical equipment: none   Patient has been advised to f/u with their PCP in 1-2 weeks to bring them up to date on their rehab stay.  Social services at facility was responsible for arranging this appointment.  Pt was provided with a 30 day supply of prescriptions for medications and refills must be obtained from their PCP.  For controlled substances, a more limited supply may be provided adequate until PCP appointment only.  Future labs/tests needed:  Cbc/diff, cmp, lipid panel, A1c

## 2020-06-24 ENCOUNTER — Encounter (INDEPENDENT_AMBULATORY_CARE_PROVIDER_SITE_OTHER): Payer: Self-pay

## 2020-06-29 ENCOUNTER — Encounter (INDEPENDENT_AMBULATORY_CARE_PROVIDER_SITE_OTHER): Payer: Self-pay | Admitting: Ophthalmology

## 2020-06-29 ENCOUNTER — Other Ambulatory Visit: Payer: Self-pay

## 2020-06-29 ENCOUNTER — Ambulatory Visit (INDEPENDENT_AMBULATORY_CARE_PROVIDER_SITE_OTHER): Payer: Medicare Other | Admitting: Ophthalmology

## 2020-06-29 DIAGNOSIS — H353132 Nonexudative age-related macular degeneration, bilateral, intermediate dry stage: Secondary | ICD-10-CM

## 2020-06-29 DIAGNOSIS — H35371 Puckering of macula, right eye: Secondary | ICD-10-CM | POA: Diagnosis not present

## 2020-06-29 DIAGNOSIS — H43813 Vitreous degeneration, bilateral: Secondary | ICD-10-CM

## 2020-06-29 DIAGNOSIS — I639 Cerebral infarction, unspecified: Secondary | ICD-10-CM

## 2020-06-29 NOTE — Assessment & Plan Note (Signed)

## 2020-06-29 NOTE — Progress Notes (Signed)
06/29/2020     CHIEF COMPLAINT Patient presents for Retina Follow Up (NP AMD OU, OS>OD, Ref'd by Dr. Molly Maduro Groat//Pt c/o blur OS>OD x 1 year approx. Pt denies ocular pain, flashes, or floaters OU.)   HISTORY OF PRESENT ILLNESS: Shelley Sloan is a 87 y.o. female who presents to the clinic today for:   HPI     Retina Follow Up           Diagnosis: Dry AMD   Laterality: left eye   Onset: 1 year ago   Severity: moderate   Duration: 1 year   Course: gradually worsening   Comments: NP AMD OU, OS>OD, Ref'd by Dr. Ernesto Rutherford  Pt c/o blur OS>OD x 1 year approx. Pt denies ocular pain, flashes, or floaters OU.       Last edited by Gwendel Hanson, COA on 06/29/2020  3:29 PM.      Referring physician: Octavia Heir, NP [address] [place],  [place] [address]  HISTORICAL INFORMATION:   Selected notes from the MEDICAL RECORD NUMBER    Lab Results  Component Value Date   HGBA1C 5.8 (H) 04/07/2020     CURRENT MEDICATIONS: No current outpatient medications on file. (Ophthalmic Drugs)   No current facility-administered medications for this visit. (Ophthalmic Drugs)   Current Outpatient Medications (Other)  Medication Sig   albuterol (VENTOLIN HFA) 108 (90 Base) MCG/ACT inhaler Inhale 1 puff into the lungs as needed for wheezing or shortness of breath.   amLODipine (NORVASC) 10 MG tablet Take 0.5 tablets (5 mg total) by mouth daily.   atorvastatin (LIPITOR) 10 MG tablet Take 2 tablets (20 mg total) by mouth daily.   cetirizine (ZYRTEC) 10 MG tablet Take 10 mg by mouth daily.   cholecalciferol (VITAMIN D3) 25 MCG (1000 UNIT) tablet Take 1,000 Units by mouth daily.   clopidogrel (PLAVIX) 75 MG tablet Take 1 tablet (75 mg total) by mouth daily.   hydrocortisone 1 % ointment Apply 1 application topically 4 (four) times daily.   levothyroxine (SYNTHROID) 50 MCG tablet Take 50 mcg by mouth daily.   meclizine (ANTIVERT) 12.5 MG tablet Take 1 tablet (12.5 mg total) by mouth 3  (three) times daily as needed for dizziness.   Multiple Vitamins-Minerals (ICAPS AREDS 2 PO) Take 1 capsule by mouth 2 (two) times daily.   omeprazole (PRILOSEC) 20 MG capsule Take 20 mg by mouth daily.   zinc oxide 20 % ointment Apply 1 application topically as needed for irritation.   No current facility-administered medications for this visit. (Other)      REVIEW OF SYSTEMS:    ALLERGIES Allergies  Allergen Reactions   Sulfa Antibiotics Hives    PAST MEDICAL HISTORY Past Medical History:  Diagnosis Date   Arthritis    Hypertension    Past Surgical History:  Procedure Laterality Date   ABDOMINAL HYSTERECTOMY     JOINT REPLACEMENT Right 1995   JOINT REPLACEMENT Left 1997    FAMILY HISTORY History reviewed. No pertinent family history.  SOCIAL HISTORY Social History   Tobacco Use   Smoking status: Former    Packs/day: 0.50    Years: 18.00    Pack years: 9.00    Types: Cigarettes    Quit date: 1975    Years since quitting: 47.5   Smokeless tobacco: Never  Vaping Use   Vaping Use: Never used  Substance Use Topics   Alcohol use: Yes   Drug use: Never  OPHTHALMIC EXAM:  Base Eye Exam     Visual Acuity (ETDRS)       Right Left   Dist cc 20/20 20/25 +1    Correction: Glasses         Tonometry (Tonopen, 3:34 PM)       Right Left   Pressure 13 11         Pupils       Dark Light Shape React APD   Right 2.5 2 Round Sluggish None   Left 2.5 2 Round Sluggish None         Visual Fields (Counting fingers)       Left Right    Full Full         Extraocular Movement       Right Left    Full Full         Neuro/Psych     Oriented x3: Yes   Mood/Affect: Normal         Dilation     Both eyes: 1.0% Mydriacyl, 2.5% Phenylephrine @ 3:34 PM           Slit Lamp and Fundus Exam     External Exam       Right Left   External Normal Normal         Slit Lamp Exam       Right Left   Lids/Lashes Normal Normal    Conjunctiva/Sclera White and quiet White and quiet   Cornea Clear Clear   Anterior Chamber Deep and quiet Deep and quiet   Iris Round and reactive Round and reactive   Lens Posterior chamber intraocular lens Posterior chamber intraocular lens   Anterior Vitreous Normal Normal         Fundus Exam       Right Left   Posterior Vitreous Posterior vitreous detachment Posterior vitreous detachment   Disc Normal Normal   C/D Ratio 0.45 0.55   Macula Intermediate age related macular degeneration, Soft drusen,, no macular thickening Intermediate age related macular degeneration, Soft drusen,, no macular thickening   Vessels Normal Normal   Periphery Normal Normal            IMAGING AND PROCEDURES  Imaging and Procedures for 06/29/20  OCT, Retina - OU - Both Eyes       Right Eye Quality was good. Scan locations included subfoveal. Central Foveal Thickness: 308. Progression has no prior data. Findings include no SRF, no IRF, abnormal foveal contour, macular pucker.   Left Eye Quality was good. Scan locations included subfoveal. Central Foveal Thickness: 294. Progression has no prior data. Findings include abnormal foveal contour, retinal drusen , no SRF, no IRF.   Notes Small pigment epithelial detachment solid drusen change no active CNVM nasal to FAZ OD  OS with small similar hard Solid pigment epithelial detachment drusenoid in appearance with no signs of CNVM temporal to the fovea             ASSESSMENT/PLAN:  Posterior vitreous detachment of both eyes  The nature of posterior vitreous detachment was discussed with the patient as well as its physiology, its age prevalence, and its possible implication regarding retinal breaks and detachment.  An informational brochure was offered to the patient.  All the patient's questions were answered.  The patient was asked to return if new or different flashes or floaters develops.   Patient was instructed to contact office  immediately if any new changes were noticed. I explained to the patient  that vitreous inside the eye is similar to jello inside a bowl. As the jello melts it can start to pull away from the bowl, similarly the vitreous throughout our lives can begin to pull away from the retina. That process is called a posterior vitreous detachment. In some cases, the vitreous can tug hard enough on the retina to form a retinal tear. I discussed with the patient the signs and symptoms of a retinal detachment.  Do not rub the eye.    Intermediate stage nonexudative age-related macular degeneration of both eyes The nature of age--related macular degeneration was discussed with the patient as well as the distinction between dry and wet types. Checking an Amsler Grid daily with advice to return immediately should a distortion develop, was given to the patient. The patient 's smoking status now and in the past was determined and advice based on the AREDS study was provided regarding the consumption of antioxidant supplements. AREDS 2 vitamin formulation was recommended. Consumption of dark leafy vegetables and fresh fruits of various colors was recommended. Treatment modalities for wet macular degeneration particularly the use of intravitreal injections of anti-blood vessel growth factors was discussed with the patient. Avastin, Lucentis, and Eylea are the available options. On occasion, therapy includes the use of photodynamic therapy and thermal laser. Stressed to the patient do not rub eyes.  Patient was advised to check Amsler Grid daily and return immediately if changes are noted. Instructions on using the grid were given to the patient. All patient questions were answered.  Right epiretinal membrane The nature of macular pucker (epiretinal membrane ERM) was discussed with the patient as well as threshold criteria for vitrectomy surgery. I explained that in rare cases another surgery is needed to actually remove a second  wrinkle should it regrow.  Most often, the epiretinal membrane and underlying wrinkled internal limiting membrane are removed with the first surgery, to accomplish the goals.   If the operative eye is Phakic (natural lens still present), cataract surgery is often recommended prior to Vitrectomy. This will enable the retina surgeon to have the best view during surgery and the patient to obtain optimal results in the future. Treatment options were discussed.      ICD-10-CM   1. Intermediate stage nonexudative age-related macular degeneration of both eyes  H35.3132 OCT, Retina - OU - Both Eyes    2. Right epiretinal membrane  H35.371 OCT, Retina - OU - Both Eyes    3. Posterior vitreous detachment of both eyes  H43.813       1.  Intermediate ARMD, no signs of CNVM  2.  Patient has option to use AREDS 2 vitamin therapy  3.  Ophthalmic Meds Ordered this visit:  No orders of the defined types were placed in this encounter.      Return in about 6 months (around 12/29/2020) for DILATE OU, OCT.  There are no Patient Instructions on file for this visit.   Explained the diagnoses, plan, and follow up with the patient and they expressed understanding.  Patient expressed understanding of the importance of proper follow up care.   Alford HighlandGary A. Hayes Czaja M.D. Diseases & Surgery of the Retina and Vitreous [facility] 06/29/20     Abbreviations: M myopia (nearsighted); A astigmatism; H hyperopia (farsighted); P presbyopia; Mrx spectacle prescription;  CTL contact lenses; OD right eye; OS left eye; OU both eyes  XT exotropia; ET esotropia; PEK punctate epithelial keratitis; PEE punctate epithelial erosions; DES dry eye syndrome;  MGD meibomian gland dysfunction; ATs artificial tears; PFAT's preservative free artificial tears; NSC nuclear sclerotic cataract; PSC posterior subcapsular cataract; ERM epi-retinal membrane; PVD posterior vitreous detachment; RD retinal detachment; DM diabetes  mellitus; DR diabetic retinopathy; NPDR non-proliferative diabetic retinopathy; PDR proliferative diabetic retinopathy; CSME clinically significant macular edema; DME diabetic macular edema; dbh dot blot hemorrhages; CWS cotton wool spot; POAG primary open angle glaucoma; C/D cup-to-disc ratio; HVF humphrey visual field; GVF goldmann visual field; OCT optical coherence tomography; IOP intraocular pressure; BRVO Branch retinal vein occlusion; CRVO central retinal vein occlusion; CRAO central retinal artery occlusion; BRAO branch retinal artery occlusion; RT retinal tear; SB scleral buckle; PPV pars plana vitrectomy; VH Vitreous hemorrhage; PRP panretinal laser photocoagulation; IVK intravitreal kenalog; VMT vitreomacular traction; MH Macular hole;  NVD neovascularization of the disc; NVE neovascularization elsewhere; AREDS age related eye disease study; ARMD age related macular degeneration; POAG primary open angle glaucoma; EBMD epithelial/anterior basement membrane dystrophy; ACIOL anterior chamber intraocular lens; IOL intraocular lens; PCIOL posterior chamber intraocular lens; Phaco/IOL phacoemulsification with intraocular lens placement; PRK photorefractive keratectomy; LASIK laser assisted in situ keratomileusis; HTN hypertension; DM diabetes mellitus; COPD chronic obstructive pulmonary disease

## 2020-06-29 NOTE — Assessment & Plan Note (Signed)

## 2020-06-29 NOTE — Assessment & Plan Note (Signed)

## 2020-06-30 ENCOUNTER — Other Ambulatory Visit: Payer: Self-pay | Admitting: Orthopedic Surgery

## 2020-07-08 ENCOUNTER — Other Ambulatory Visit: Payer: Self-pay

## 2020-07-08 NOTE — Patient Outreach (Signed)
[facility]) Care Management  07/08/2020  [facility] 24-May-1923 [id number]   First telephone outreach attempt to obtain mRS. No answer. Left message for returned call.  [name] [facility] Management Assistant [phone]

## 2020-07-13 ENCOUNTER — Other Ambulatory Visit: Payer: Self-pay

## 2020-07-13 NOTE — Patient Outreach (Signed)
[facility]) Care Management  07/13/2020  Shelley Sloan 21-Mar-1923 [id number]   Second telephone outreach attempt to obtain mRS. No answer. Left message for returned call.  [name] [facility] Management Assistant [phone]

## 2020-07-20 ENCOUNTER — Encounter: Payer: Self-pay | Admitting: *Deleted

## 2020-07-20 ENCOUNTER — Other Ambulatory Visit: Payer: Self-pay

## 2020-07-20 NOTE — Patient Outreach (Signed)
[facility]) Care Management  07/20/2020  Shelley Sloan 26-May-1923 [id number]   3 outreach attempts were completed to obtain mRs. mRs could not be obtained because patient never returned my calls. mRs=7    [name]  Care Management Assistant [phone]

## 2020-07-21 ENCOUNTER — Encounter: Payer: Self-pay | Admitting: *Deleted

## 2020-07-22 ENCOUNTER — Encounter: Payer: Self-pay | Admitting: *Deleted

## 2020-08-13 ENCOUNTER — Encounter: Payer: Self-pay | Admitting: Adult Health

## 2020-08-13 ENCOUNTER — Ambulatory Visit: Payer: Medicare Other | Admitting: Adult Health

## 2020-08-25 ENCOUNTER — Encounter: Payer: Self-pay | Admitting: Adult Health

## 2020-08-25 ENCOUNTER — Ambulatory Visit (INDEPENDENT_AMBULATORY_CARE_PROVIDER_SITE_OTHER): Payer: Medicare Other | Admitting: Adult Health

## 2020-08-25 VITALS — BP 147/75 | HR 80 | Ht 62.0 in | Wt 121.0 lb

## 2020-08-25 DIAGNOSIS — I10 Essential (primary) hypertension: Secondary | ICD-10-CM | POA: Diagnosis not present

## 2020-08-25 DIAGNOSIS — I639 Cerebral infarction, unspecified: Secondary | ICD-10-CM

## 2020-08-25 DIAGNOSIS — E785 Hyperlipidemia, unspecified: Secondary | ICD-10-CM | POA: Diagnosis not present

## 2020-08-25 NOTE — Patient Instructions (Addendum)
Continue clopidogrel 75 mg daily  and atorvastatin 20 mg daily for secondary stroke prevention  Continue to follow up with PCP regarding cholesterol and blood pressure management  Maintain strict control of hypertension with blood pressure goal below 130/90 and cholesterol with LDL cholesterol (bad cholesterol) goal below 70 mg/dL.        Thank you for coming to see [place] at [facility]. I hope we have been able to provide you high quality care today.  You may receive a patient satisfaction survey over the next few weeks. We would appreciate your feedback and comments so that we may continue to improve ourselves and the health of our patients.

## 2020-08-25 NOTE — Progress Notes (Signed)
[facility] [address] [place] [address] [phone]       STROKE FOLLOW UP NOTE  Ms. Shelley Sloan Date of Birth:  1923-10-27 Medical Record Number:  [id number]   Reason for Referral:  stroke follow up    SUBJECTIVE:   CHIEF COMPLAINT:  Chief Complaint  Patient presents with   Follow-up    Rm 3 alone  Pt is well,      HPI:   Today, 08/25/2020, Shelley Sloan returns for 80-month stroke follow-up unaccompanied.  Overall stable.  She has since returned back to independent living at [facility] back in May.  She does have a caregiver that mainly assists with transportation and some other IADLs but has been slowly resuming independent activities.  Continues to use rollator walker and denies any recent falls.  denies new stroke/TIA symptoms.  Of note, she tested positive for COVID-19 on 5/9 (5 days after prior visit) - recovered well without residual symptoms.  Reports compliance on Plavix and atorvastatin 20 mg daily without side effects.  Blood pressure today 147/75. Routinely monitors at home and typically stable. No further concerns at this time.    History provided for reference purposes only Initial visit 05/06/2020 JM: Shelley Sloan is being seen for hospital follow-up accompanied by her son, Shelley Sloan.   She has recovered well and denies residual deficits Working with PT for gait and generalized strengthening. Using a RW for ambulation - previously using a cane. She continues to reside in skilled nursing at [facility] with goals of returning back to her apartment in the independent living section in the near future. Denies new stroke/TIA symptoms  Completed 3 weeks DAPT and remains on Plavix alone without associated side effects Continues on atorvastatin 20 mg daily without associated side effects Blood pressure today 153/68 - monitors at facility which has varied - typically 130-150s   No further concerns at this time  Stroke admission  04/06/2020 Shelley Sloan is a 87 y.o. female past medical history of hypertension, arthritis, living at an assisted living facility, last known well at 47 AM on 04/06/2020 when she woke up and went to the bathroom without any problems, went back to take a nap and at around 10 AM noticed that her left side was weaker.  Personally reviewed hospitalization pertinent progress notes, lab work and imaging with summary provided.  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  Recommended DAPT for 3 weeks and Plavix alone.  HTN on amlodipine 5 mg daily increase to 10 mg daily due to elevated BPs.  LDL 96 and initiate atorvastatin 20 mg daily.  Other stroke risk factors include advanced age but no prior stroke history.  Residual deficit of mild dysmetria on left hand and therapy recommended discharge to SNF at [facility] where she currently resides in ALF.   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  MRI Small acute infarction at the right lateral thalamus/posterior limb internal capsule. No hemorrhage or mass effect. MRA head normal intracranial MR angiography of the large and medium size vessels. CUS unremarkable 2D echo EF 60 to 65% LDL 96 HgbA1c 5.8 VTE prophylaxis -Lovenox aspirin 81 mg daily prior to admission, now on ASA + Plavix for 3 weeks and the Plavix alone Therapy recommendations:  SNF Disposition:  SNF    ROS:   14 system review of  systems performed and negative with exception of those listed in HPI  PMH:  Past Medical History:  Diagnosis Date   Arthritis    Hypertension     PSH:  Past Surgical History:  Procedure Laterality Date   ABDOMINAL HYSTERECTOMY     JOINT REPLACEMENT Right 1995   JOINT REPLACEMENT Left 1997    Social History:  Social History   Socioeconomic History   Marital status: Married    Spouse name: Not on file    Number of children: Not on file   Years of education: Not on file   Highest education level: Not on file  Occupational History   Not on file  Tobacco Use   Smoking status: Former    Packs/day: 0.50    Years: 18.00    Pack years: 9.00    Types: Cigarettes    Quit date: 53    Years since quitting: 47.6   Smokeless tobacco: Never  Vaping Use   Vaping Use: Never used  Substance and Sexual Activity   Alcohol use: Yes   Drug use: Never   Sexual activity: Not on file  Other Topics Concern   Not on file  Social History Narrative   Not on file   Social Determinants of Health   Financial Resource Strain: Not on file  Food Insecurity: Not on file  Transportation Needs: Not on file  Physical Activity: Not on file  Stress: Not on file  Social Connections: Not on file  Intimate Partner Violence: Not on file    Family History: History reviewed. No pertinent family history.  Medications:   Current Outpatient Medications on File Prior to Visit  Medication Sig Dispense Refill   albuterol (VENTOLIN HFA) 108 (90 Base) MCG/ACT inhaler Inhale 1 puff into the lungs as needed for wheezing or shortness of breath.     amLODipine (NORVASC) 10 MG tablet Take 0.5 tablets (5 mg total) by mouth daily. 30 tablet 0   atorvastatin (LIPITOR) 10 MG tablet Take 2 tablets (20 mg total) by mouth daily. 60 tablet 1   cetirizine (ZYRTEC) 10 MG tablet Take 10 mg by mouth as needed.     cholecalciferol (VITAMIN D3) 25 MCG (1000 UNIT) tablet Take 1,000 Units by mouth daily.     clopidogrel (PLAVIX) 75 MG tablet Take 1 tablet (75 mg total) by mouth daily. 30 tablet 0   hydrocortisone 1 % ointment Apply 1 application topically as needed.     levothyroxine (SYNTHROID) 50 MCG tablet Take 50 mcg by mouth daily.     meclizine (ANTIVERT) 12.5 MG tablet Take 1 tablet (12.5 mg total) by mouth 3 (three) times daily as needed for dizziness. 30 tablet 0   zinc oxide 20 % ointment Apply 1 application topically as needed  for irritation.     No current facility-administered medications on file prior to visit.    Allergies:   Allergies  Allergen Reactions   Sulfa Antibiotics Hives      OBJECTIVE:  Physical Exam  Vitals:   08/25/20 1334  BP: (!) 147/75  Pulse: 80  Weight: 121 lb (54.9 kg)  Height: 5\' 2"  (1.575 m)    Body mass index is 22.13 kg/m. No results found.  General: Frail very pleasant elderly Caucasian female, seated, in no evident distress Head: head normocephalic and atraumatic.   Neck: supple with no carotid or supraclavicular bruits Cardiovascular: regular rate and rhythm, no murmurs Musculoskeletal: no deformity Skin:  no rash/petichiae Vascular:  Normal pulses all extremities  Neurologic Exam Mental Status: Awake and fully alert. Fluent speech and language.  Oriented to place and time. Recent and remote memory intact. Attention span, concentration and fund of knowledge appropriate. Mood and affect appropriate.  Cranial Nerves: Pupils equal, briskly reactive to light. Extraocular movements full without nystagmus. Visual fields full to confrontation. Hearing intact. Facial sensation intact. Face, tongue, palate moves normally and symmetrically.  Motor: Normal bulk and tone. Normal strength in all tested extremity muscles  Sensory.: intact to touch , pinprick , position and vibratory sensation.  Coordination: Rapid alternating movements normal in all extremities, Finger-to-nose and heel-to-shin performed accurately bilaterally. Gait and Station: Stand from seated position without difficulty. Gait demonstrates short shuffled steps without imbalance or unsteadiness with use of Rollator walker.  Tandem walk and heel toe not attempted. Reflexes: 1+ and symmetric. Toes downgoing.          ASSESSMENT: Shelley Sloan is a 87 y.o. year old female presented with left-sided weakness on 04/06/2020 with stroke work-up revealing acute infarct of right thalamus/PLIC sp tPA likely  secondary to small vessel disease. Vascular risk factors include HTN, HLD and advanced age.      PLAN:  R thalamic/PLIC stroke :  Residual deficit: Recovered well without residual deficit.  Discussed continued use of Rollator walker at all times for fall prevention. Continue clopidogrel 75 mg daily  and atorvastatin 20 mg daily for secondary stroke prevention.  Discussed secondary stroke prevention measures and importance of close PCP follow up for aggressive stroke risk factor management  HTN: BP goal <130/90.  Slightly elevated today on amlodipine 10 mg daily per PCP HLD: LDL goal <70. Recent LDL 54 (07/2020) on atorvastatin 20 mg daily per PCP    Per patient request, follow-up on an as-needed basis as she routinely follows with PCP   CC:  [facility] provider: Dr. Pearlean Brownie PCP: Charlane Ferretti, DO     I spent 33 minutes of face-to-face and non-face-to-face time with patient.  This included previsit chart review, lab review, study review, electronic health record documentation, patient education and discussion regarding prior stroke, secondary stroke prevention measures and aggressive stroke risk factor management and answered all other questions to patient's satisfaction   Ihor Austin, [facility]  [facility] [address] [place], [place] [address]  Phone [phone] Fax [phone] Note: This document was prepared with digital dictation and possible smart phrase technology. Any transcriptional errors that result from this process are unintentional.

## 2020-08-26 NOTE — Progress Notes (Signed)
I agree with the above plan 

## 2020-12-29 ENCOUNTER — Encounter (INDEPENDENT_AMBULATORY_CARE_PROVIDER_SITE_OTHER): Payer: Medicare Other | Admitting: Ophthalmology

## 2021-01-11 ENCOUNTER — Encounter (INDEPENDENT_AMBULATORY_CARE_PROVIDER_SITE_OTHER): Payer: Medicare Other | Admitting: Ophthalmology

## 2021-02-17 ENCOUNTER — Other Ambulatory Visit: Payer: Self-pay

## 2021-02-17 ENCOUNTER — Ambulatory Visit (INDEPENDENT_AMBULATORY_CARE_PROVIDER_SITE_OTHER): Payer: Medicare Other | Admitting: Ophthalmology

## 2021-02-17 ENCOUNTER — Encounter (INDEPENDENT_AMBULATORY_CARE_PROVIDER_SITE_OTHER): Payer: Self-pay | Admitting: Ophthalmology

## 2021-02-17 DIAGNOSIS — H35371 Puckering of macula, right eye: Secondary | ICD-10-CM

## 2021-02-17 DIAGNOSIS — H353221 Exudative age-related macular degeneration, left eye, with active choroidal neovascularization: Secondary | ICD-10-CM

## 2021-02-17 DIAGNOSIS — H353132 Nonexudative age-related macular degeneration, bilateral, intermediate dry stage: Secondary | ICD-10-CM

## 2021-02-17 MED ORDER — BEVACIZUMAB 2.5 MG/0.1ML IZ SOSY
2.5000 mg | PREFILLED_SYRINGE | INTRAVITREAL | Status: AC | PRN
  Administered 2021-02-17: 2.5 mg via INTRAVITREAL

## 2021-02-17 NOTE — Progress Notes (Signed)
02/17/2021     CHIEF COMPLAINT Patient presents for  Chief Complaint  Patient presents with   Macular Degeneration   No interval change in acuity  HISTORY OF PRESENT ILLNESS: Shelley Sloan is a 88 y.o. female who presents to the clinic today for:   HPI   Non-exudative ARMD, 6 mo fu OU OCT. Patient states vision seems to be worsening at a distance. Denies any new floaters or FOL.  Last edited by Nelva Nay on 02/17/2021  1:30 PM.      Referring physician: Octavia Heir, NP [address] [place],  [place] [address]  HISTORICAL INFORMATION:   Selected notes from the MEDICAL RECORD NUMBER    Lab Results  Component Value Date   HGBA1C 5.8 (H) 04/07/2020     CURRENT MEDICATIONS: No current outpatient medications on file. (Ophthalmic Drugs)   No current facility-administered medications for this visit. (Ophthalmic Drugs)   Current Outpatient Medications (Other)  Medication Sig   albuterol (VENTOLIN HFA) 108 (90 Base) MCG/ACT inhaler Inhale 1 puff into the lungs as needed for wheezing or shortness of breath.   amLODipine (NORVASC) 10 MG tablet Take 0.5 tablets (5 mg total) by mouth daily.   atorvastatin (LIPITOR) 10 MG tablet Take 2 tablets (20 mg total) by mouth daily.   cetirizine (ZYRTEC) 10 MG tablet Take 10 mg by mouth as needed.   cholecalciferol (VITAMIN D3) 25 MCG (1000 UNIT) tablet Take 1,000 Units by mouth daily.   clopidogrel (PLAVIX) 75 MG tablet Take 1 tablet (75 mg total) by mouth daily.   hydrocortisone 1 % ointment Apply 1 application topically as needed.   levothyroxine (SYNTHROID) 50 MCG tablet Take 50 mcg by mouth daily.   meclizine (ANTIVERT) 12.5 MG tablet Take 1 tablet (12.5 mg total) by mouth 3 (three) times daily as needed for dizziness.   zinc oxide 20 % ointment Apply 1 application topically as needed for irritation.   No current facility-administered medications for this visit. (Other)      REVIEW OF SYSTEMS: ROS   Negative for:  Constitutional, Gastrointestinal, Neurological, Skin, Genitourinary, Musculoskeletal, HENT, Endocrine, Cardiovascular, Eyes, Respiratory, Psychiatric, Allergic/Imm, Heme/Lymph Last edited by Edmon Crape, MD on 02/17/2021  2:17 PM.       ALLERGIES Allergies  Allergen Reactions   Sulfa Antibiotics Hives    PAST MEDICAL HISTORY Past Medical History:  Diagnosis Date   Arthritis    Hypertension    Past Surgical History:  Procedure Laterality Date   ABDOMINAL HYSTERECTOMY     JOINT REPLACEMENT Right 1995   JOINT REPLACEMENT Left 1997    FAMILY HISTORY History reviewed. No pertinent family history.  SOCIAL HISTORY Social History   Tobacco Use   Smoking status: Former    Packs/day: 0.50    Years: 18.00    Pack years: 9.00    Types: Cigarettes    Quit date: 1975    Years since quitting: 48.1   Smokeless tobacco: Never  Vaping Use   Vaping Use: Never used  Substance Use Topics   Alcohol use: Yes   Drug use: Never         OPHTHALMIC EXAM:  Base Eye Exam     Visual Acuity (ETDRS)       Right Left   Dist cc 20/25 +2 20/40 -2+2   Dist ph cc  NI    Correction: Glasses         Tonometry (Tonopen, 1:34 PM)  Right Left   Pressure 16 14         Pupils       Pupils Dark Light React APD   Right PERRL 3 2 Slow None   Left PERRL 3 2 Slow None         Visual Fields (Counting fingers)       Left Right    Full Full         Extraocular Movement       Right Left    Full Full         Neuro/Psych     Oriented x3: Yes   Mood/Affect: Normal         Dilation     Both eyes: 1.0% Mydriacyl, 2.5% Phenylephrine @ 1:34 PM           Slit Lamp and Fundus Exam     External Exam       Right Left   External Normal Normal         Slit Lamp Exam       Right Left   Lids/Lashes Normal Normal   Conjunctiva/Sclera White and quiet White and quiet   Cornea Clear Clear   Anterior Chamber Deep and quiet Deep and quiet   Iris Round  and reactive Round and reactive   Lens Posterior chamber intraocular lens Posterior chamber intraocular lens   Anterior Vitreous Normal Normal         Fundus Exam       Right Left   Posterior Vitreous Posterior vitreous detachment Posterior vitreous detachment   Disc Normal Normal   C/D Ratio 0.45 0.55   Macula Intermediate age related macular degeneration, Soft drusen,, no macular thickening Intermediate age related macular degeneration, Soft drusen,, no macular thickening   Vessels Normal Normal   Periphery Normal Normal            IMAGING AND PROCEDURES  Imaging and Procedures for 02/17/21  OCT, Retina - OU - Both Eyes       Right Eye Quality was good. Scan locations included subfoveal. Central Foveal Thickness: 308. Progression has no prior data. Findings include no SRF, no IRF, abnormal foveal contour, macular pucker.   Left Eye Quality was good. Scan locations included subfoveal. Central Foveal Thickness: 294. Progression has no prior data. Findings include abnormal foveal contour, retinal drusen , subretinal fluid, intraretinal fluid.   Notes Small pigment epithelial detachment solid drusen change no active CNVM nasal to FAZ OD  OS with small similar hard Solid pigment epithelial detachment, today with new intraretinal and subretinal fluid left eye near the center of the vision.  Signifying wet AMD     Intravitreal Injection, Pharmacologic Agent - OS - Left Eye       Time Out 02/17/2021. 2:23 PM. Confirmed correct patient, procedure, site, and patient consented.   Anesthesia Topical anesthesia was used. Anesthetic medications included Lidocaine 4%.   Procedure Preparation included 5% betadine to ocular surface, 10% betadine to eyelids. A 30 gauge needle was used.   Injection: 2.5 mg bevacizumab 2.5 MG/0.1ML   Route: Intravitreal, Site: Left Eye   NDC: [id number], Lot: [id number]   Post-op Post injection exam found visual acuity of at least counting  fingers. The patient tolerated the procedure well. There were no complications. The patient received written and verbal post procedure care education. Post injection medications included ocuflox.              ASSESSMENT/PLAN:  Exudative age-related macular degeneration of  left eye with active choroidal neovascularization (HCC) OS, new onset intraretinal subretinal fluid juxta foveal of nearing center of the FAZ, will need antivegF recommended today, Avastin injection offered and patient consents to proceed today  Right epiretinal membrane Minor no impact on acuity  Intermediate stage nonexudative age-related macular degeneration of both eyes The nature of age--related macular degeneration was discussed with the patient as well as the distinction between dry and wet types. Checking an Amsler Grid daily with advice to return immediately should a distortion develop, was given to the patient. The patient 's smoking status now and in the past was determined and advice based on the AREDS study was provided regarding the consumption of antioxidant supplements. AREDS 2 vitamin formulation was recommended. Consumption of dark leafy vegetables and fresh fruits of various colors was recommended. Treatment modalities for wet macular degeneration particularly the use of intravitreal injections of anti-blood vessel growth factors was discussed with the patient. Avastin, Lucentis, and Eylea are the available options. On occasion, therapy includes the use of photodynamic therapy and thermal laser. Stressed to the patient do not rub eyes.  Patient was advised to check Amsler Grid daily and return immediately if changes are noted. Instructions on using the grid were given to the patient. All patient questions were answered.    No sign of CNVM OD       ICD-10-CM   1. Exudative age-related macular degeneration of left eye with active choroidal neovascularization (HCC)  H35.3221 Intravitreal Injection,  Pharmacologic Agent - OS - Left Eye    bevacizumab (AVASTIN) SOSY 2.5 mg    2. Intermediate stage nonexudative age-related macular degeneration of both eyes  H35.3132 OCT, Retina - OU - Both Eyes    3. Right epiretinal membrane  H35.371       1.  OS with wet AMD.  New-onset no current impact on acuity but threatening foveal region.  Risk benefits of therapy and the typical course of therapy were reviewed with the patient  We will commence with therapy with intravitreal Avastin OS today 2.  3.  Ophthalmic Meds Ordered this visit:  Meds ordered this encounter  Medications   bevacizumab (AVASTIN) SOSY 2.5 mg       Return in about 5 weeks (around 03/24/2021) for dilate, OS, AVASTIN OCT.  There are no Patient Instructions on file for this visit.   Explained the diagnoses, plan, and follow up with the patient and they expressed understanding.  Patient expressed understanding of the importance of proper follow up care.   Alford Highland Gaynel Sloan M.D. Diseases & Surgery of the Retina and Vitreous [facility] 02/17/21     Abbreviations: M myopia (nearsighted); A astigmatism; H hyperopia (farsighted); P presbyopia; Mrx spectacle prescription;  CTL contact lenses; OD right eye; OS left eye; OU both eyes  XT exotropia; ET esotropia; PEK punctate epithelial keratitis; PEE punctate epithelial erosions; DES dry eye syndrome; MGD meibomian gland dysfunction; ATs artificial tears; PFAT's preservative free artificial tears; NSC nuclear sclerotic cataract; PSC posterior subcapsular cataract; ERM epi-retinal membrane; PVD posterior vitreous detachment; RD retinal detachment; DM diabetes mellitus; DR diabetic retinopathy; NPDR non-proliferative diabetic retinopathy; PDR proliferative diabetic retinopathy; CSME clinically significant macular edema; DME diabetic macular edema; dbh dot blot hemorrhages; CWS cotton wool spot; POAG primary open angle glaucoma; C/D cup-to-disc ratio; HVF humphrey  visual field; GVF goldmann visual field; OCT optical coherence tomography; IOP intraocular pressure; BRVO Branch retinal vein occlusion; CRVO central retinal vein occlusion; CRAO central retinal artery occlusion;  occlusion; BRAO branch retinal artery occlusion; RT retinal tear; SB scleral buckle; PPV pars plana vitrectomy; VH Vitreous hemorrhage; PRP panretinal laser photocoagulation; IVK intravitreal kenalog; VMT vitreomacular traction; MH Macular hole;  NVD neovascularization of the disc; NVE neovascularization elsewhere; AREDS age related eye disease study; ARMD age related macular degeneration; POAG primary open angle glaucoma; EBMD epithelial/anterior basement membrane dystrophy; ACIOL anterior chamber intraocular lens; IOL intraocular lens; PCIOL posterior chamber intraocular lens; Phaco/IOL phacoemulsification with intraocular lens placement; [place] photorefractive keratectomy; LASIK laser assisted in situ keratomileusis; HTN hypertension; DM diabetes mellitus; COPD chronic obstructive pulmonary disease

## 2021-02-17 NOTE — Assessment & Plan Note (Addendum)
The nature of age--related macular degeneration was discussed with the patient as well as the distinction between dry and wet types. Checking an Amsler Grid daily with advice to return immediately should a distortion develop, was given to the patient. The patient 's smoking status now and in the past was determined and advice based on the AREDS study was provided regarding the consumption of antioxidant supplements. AREDS 2 vitamin formulation was recommended. Consumption of dark leafy vegetables and fresh fruits of various colors was recommended. Treatment modalities for wet macular degeneration particularly the use of intravitreal injections of anti-blood vessel growth factors was discussed with the patient. Avastin, Lucentis, and Eylea are the available options. On occasion, therapy includes the use of photodynamic therapy and thermal laser. Stressed to the patient do not rub eyes.  Patient was advised to check Amsler Grid daily and return immediately if changes are noted. Instructions on using the grid were given to the patient. All patient questions were answered.    No sign of CNVM OD

## 2021-02-17 NOTE — Assessment & Plan Note (Signed)
Minor no impact on acuity 

## 2021-02-17 NOTE — Assessment & Plan Note (Signed)
OS, new onset intraretinal subretinal fluid juxta foveal of nearing center of the FAZ, will need antivegF recommended today, Avastin injection offered and patient consents to proceed today

## 2021-03-24 ENCOUNTER — Other Ambulatory Visit: Payer: Self-pay

## 2021-03-24 ENCOUNTER — Ambulatory Visit (INDEPENDENT_AMBULATORY_CARE_PROVIDER_SITE_OTHER): Payer: Medicare Other | Admitting: Ophthalmology

## 2021-03-24 ENCOUNTER — Encounter (INDEPENDENT_AMBULATORY_CARE_PROVIDER_SITE_OTHER): Payer: Self-pay | Admitting: Ophthalmology

## 2021-03-24 DIAGNOSIS — H353221 Exudative age-related macular degeneration, left eye, with active choroidal neovascularization: Secondary | ICD-10-CM | POA: Diagnosis not present

## 2021-03-24 DIAGNOSIS — H353132 Nonexudative age-related macular degeneration, bilateral, intermediate dry stage: Secondary | ICD-10-CM | POA: Diagnosis not present

## 2021-03-24 MED ORDER — BEVACIZUMAB 2.5 MG/0.1ML IZ SOSY
2.5000 mg | PREFILLED_SYRINGE | INTRAVITREAL | Status: AC | PRN
  Administered 2021-03-24: 2.5 mg via INTRAVITREAL

## 2021-03-24 NOTE — Assessment & Plan Note (Signed)
No sign of CNVM OD by OCT today 

## 2021-03-24 NOTE — Assessment & Plan Note (Signed)
Much improved postinjection #1 Avastin, with improved acuity as well.  Repeat injection today at 5-week interval examination ? ? ?

## 2021-03-24 NOTE — Progress Notes (Signed)
03/24/2021     CHIEF COMPLAINT Patient presents for  Chief Complaint  Patient presents with   Macular Degeneration      HISTORY OF PRESENT ILLNESS: Shelley Sloan is a 88 y.o. female who presents to the clinic today for:   HPI   5 weeks for DILATE, OS, AVASTIN OCT. Pt stated no vision changes however there is a little cloudiness in the left eye. Pt denies floaters and FOL.  Last edited by Angeline Slim on 03/24/2021 10:11 AM.      Referring physician: Charlane Ferretti, DO [address] [place],  [place] [address]  HISTORICAL INFORMATION:   Selected notes from the MEDICAL RECORD NUMBER    Lab Results  Component Value Date   HGBA1C 5.8 (H) 04/07/2020     CURRENT MEDICATIONS: No current outpatient medications on file. (Ophthalmic Drugs)   No current facility-administered medications for this visit. (Ophthalmic Drugs)   Current Outpatient Medications (Other)  Medication Sig   albuterol (VENTOLIN HFA) 108 (90 Base) MCG/ACT inhaler Inhale 1 puff into the lungs as needed for wheezing or shortness of breath.   amLODipine (NORVASC) 10 MG tablet Take 0.5 tablets (5 mg total) by mouth daily.   atorvastatin (LIPITOR) 10 MG tablet Take 2 tablets (20 mg total) by mouth daily.   cetirizine (ZYRTEC) 10 MG tablet Take 10 mg by mouth as needed.   cholecalciferol (VITAMIN D3) 25 MCG (1000 UNIT) tablet Take 1,000 Units by mouth daily.   clopidogrel (PLAVIX) 75 MG tablet Take 1 tablet (75 mg total) by mouth daily.   hydrocortisone 1 % ointment Apply 1 application topically as needed.   levothyroxine (SYNTHROID) 50 MCG tablet Take 50 mcg by mouth daily.   meclizine (ANTIVERT) 12.5 MG tablet Take 1 tablet (12.5 mg total) by mouth 3 (three) times daily as needed for dizziness.   zinc oxide 20 % ointment Apply 1 application topically as needed for irritation.   No current facility-administered medications for this visit. (Other)      REVIEW OF SYSTEMS: ROS   Negative for: Constitutional,  Gastrointestinal, Neurological, Skin, Genitourinary, Musculoskeletal, HENT, Endocrine, Cardiovascular, Eyes, Respiratory, Psychiatric, Allergic/Imm, Heme/Lymph Last edited by Angeline Slim on 03/24/2021 10:11 AM.       ALLERGIES Allergies  Allergen Reactions   Sulfa Antibiotics Hives    PAST MEDICAL HISTORY Past Medical History:  Diagnosis Date   Arthritis    Hypertension    Past Surgical History:  Procedure Laterality Date   ABDOMINAL HYSTERECTOMY     JOINT REPLACEMENT Right 1995   JOINT REPLACEMENT Left 1997    FAMILY HISTORY No family history on file.  SOCIAL HISTORY Social History   Tobacco Use   Smoking status: Former    Packs/day: 0.50    Years: 18.00    Pack years: 9.00    Types: Cigarettes    Quit date: 1975    Years since quitting: 48.2   Smokeless tobacco: Never  Vaping Use   Vaping Use: Never used  Substance Use Topics   Alcohol use: Yes   Drug use: Never         OPHTHALMIC EXAM:  Base Eye Exam     Visual Acuity (ETDRS)       Right Left   Dist cc 20/20 -1 20/25 -1+2    Correction: Glasses         Tonometry (Tonopen, 10:17 AM)       Right Left   Pressure 10 8  Pupils       Pupils Dark Light Shape React APD   Right PERRL 2 2 Round Sluggish None   Left PERRL 2 2 Round Sluggish None         Visual Fields       Left Right    Full Full         Extraocular Movement       Right Left    Full Full         Neuro/Psych     Oriented x3: Yes   Mood/Affect: Normal         Dilation     Left eye: 1.0% Mydriacyl, 2.5% Phenylephrine @ 10:17 AM           Slit Lamp and Fundus Exam     External Exam       Right Left   External Normal Normal         Slit Lamp Exam       Right Left   Lids/Lashes Normal Normal   Conjunctiva/Sclera White and quiet White and quiet   Cornea Clear Clear   Anterior Chamber Deep and quiet Deep and quiet   Iris Round and reactive Round and reactive   Lens Posterior chamber  intraocular lens Posterior chamber intraocular lens   Anterior Vitreous Normal Normal         Fundus Exam       Right Left   Posterior Vitreous  Posterior vitreous detachment   Disc  Normal   C/D Ratio  0.55   Macula  Intermediate age related macular degeneration, Soft drusen, no macular thickening, no hemorrhage   Vessels  Normal   Periphery  Normal            IMAGING AND PROCEDURES  Imaging and Procedures for 03/24/21  OCT, Retina - OU - Both Eyes       Right Eye Quality was good. Scan locations included subfoveal. Central Foveal Thickness: 322. Progression has been stable. Findings include no SRF, no IRF, abnormal foveal contour, macular pucker.   Left Eye Quality was good. Scan locations included subfoveal. Central Foveal Thickness: 280. Progression has improved. Findings include abnormal foveal contour, retinal drusen .   Notes Small pigment epithelial detachment solid drusen change not active CNVM nasal to FAZ OD  OS with smaller region of intraretinal fluid and CNVM formation superotemporal to FAZ post Avastin No. 1.  We will repeat injection today and maintain interval exam     Intravitreal Injection, Pharmacologic Agent - OS - Left Eye       Time Out 03/24/2021. 10:48 AM. Confirmed correct patient, procedure, site, and patient consented.   Anesthesia Topical anesthesia was used. Anesthetic medications included Lidocaine 4%.   Procedure Preparation included 5% betadine to ocular surface, 10% betadine to eyelids, Tobramycin 0.3%, Ofloxacin . A 30 gauge needle was used.   Injection: 2.5 mg bevacizumab 2.5 MG/0.1ML   Route: Intravitreal, Site: Left Eye   NDC: [phone], Lot: [id number]   Post-op Post injection exam found visual acuity of at least counting fingers. The patient tolerated the procedure well. There were no complications. The patient received written and verbal post procedure care education. Post injection medications included ocuflox.               ASSESSMENT/PLAN:  Exudative age-related macular degeneration of left eye with active choroidal neovascularization (HCC) Much improved postinjection #1 Avastin, with improved acuity as well.  Repeat injection today at 5-week interval examination  Intermediate stage nonexudative age-related macular degeneration of both eyes No sign of CNVM OD by OCT today     ICD-10-CM   1. Exudative age-related macular degeneration of left eye with active choroidal neovascularization (HCC)  H35.3221 OCT, Retina - OU - Both Eyes    Intravitreal Injection, Pharmacologic Agent - OS - Left Eye    bevacizumab (AVASTIN) SOSY 2.5 mg    2. Intermediate stage nonexudative age-related macular degeneration of both eyes  H35.3132       1.  OS, vastly improved post injection Avastin No. 1 for CNVM superotemporal to the FAZ.  Improved acuity confirmed.  Pete injection today and examination next again in 5 weeks  2.  No sign of CNVM OD by OCT today  3.  Ophthalmic Meds Ordered this visit:  Meds ordered this encounter  Medications   bevacizumab (AVASTIN) SOSY 2.5 mg       Return in about 5 weeks (around 04/28/2021) for dilate, OS, AVASTIN OCT.  There are no Patient Instructions on file for this visit.   Explained the diagnoses, plan, and follow up with the patient and they expressed understanding.  Patient expressed understanding of the importance of proper follow up care.   Alford Highland Robecca Fulgham M.D. Diseases & Surgery of the Retina and Vitreous [facility] 03/24/21     Abbreviations: M myopia (nearsighted); A astigmatism; H hyperopia (farsighted); P presbyopia; Mrx spectacle prescription;  CTL contact lenses; OD right eye; OS left eye; OU both eyes  XT exotropia; ET esotropia; PEK punctate epithelial keratitis; PEE punctate epithelial erosions; DES dry eye syndrome; MGD meibomian gland dysfunction; ATs artificial tears; PFAT's preservative free artificial tears; NSC  nuclear sclerotic cataract; PSC posterior subcapsular cataract; ERM epi-retinal membrane; PVD posterior vitreous detachment; RD retinal detachment; DM diabetes mellitus; DR diabetic retinopathy; NPDR non-proliferative diabetic retinopathy; PDR proliferative diabetic retinopathy; CSME clinically significant macular edema; DME diabetic macular edema; dbh dot blot hemorrhages; CWS cotton wool spot; POAG primary open angle glaucoma; C/D cup-to-disc ratio; HVF humphrey visual field; GVF goldmann visual field; OCT optical coherence tomography; IOP intraocular pressure; BRVO Branch retinal vein occlusion; CRVO central retinal vein occlusion; CRAO central retinal artery occlusion; BRAO branch retinal artery occlusion; RT retinal tear; SB scleral buckle; PPV pars plana vitrectomy; VH Vitreous hemorrhage; PRP panretinal laser photocoagulation; IVK intravitreal kenalog; VMT vitreomacular traction; MH Macular hole;  NVD neovascularization of the disc; NVE neovascularization elsewhere; AREDS age related eye disease study; ARMD age related macular degeneration; POAG primary open angle glaucoma; EBMD epithelial/anterior basement membrane dystrophy; ACIOL anterior chamber intraocular lens; IOL intraocular lens; PCIOL posterior chamber intraocular lens; Phaco/IOL phacoemulsification with intraocular lens placement; PRK photorefractive keratectomy; LASIK laser assisted in situ keratomileusis; HTN hypertension; DM diabetes mellitus; COPD chronic obstructive pulmonary disease

## 2021-03-25 ENCOUNTER — Encounter (INDEPENDENT_AMBULATORY_CARE_PROVIDER_SITE_OTHER): Payer: Medicare Other | Admitting: Ophthalmology

## 2021-04-28 ENCOUNTER — Ambulatory Visit (INDEPENDENT_AMBULATORY_CARE_PROVIDER_SITE_OTHER): Payer: Medicare Other | Admitting: Ophthalmology

## 2021-04-28 ENCOUNTER — Encounter (INDEPENDENT_AMBULATORY_CARE_PROVIDER_SITE_OTHER): Payer: Self-pay | Admitting: Ophthalmology

## 2021-04-28 DIAGNOSIS — H353221 Exudative age-related macular degeneration, left eye, with active choroidal neovascularization: Secondary | ICD-10-CM | POA: Diagnosis not present

## 2021-04-28 DIAGNOSIS — H353132 Nonexudative age-related macular degeneration, bilateral, intermediate dry stage: Secondary | ICD-10-CM | POA: Diagnosis not present

## 2021-04-28 DIAGNOSIS — H35371 Puckering of macula, right eye: Secondary | ICD-10-CM

## 2021-04-28 MED ORDER — BEVACIZUMAB 2.5 MG/0.1ML IZ SOSY
2.5000 mg | PREFILLED_SYRINGE | INTRAVITREAL | Status: AC | PRN
  Administered 2021-04-28: 2.5 mg via INTRAVITREAL

## 2021-04-28 NOTE — Progress Notes (Signed)
? ? ?04/28/2021 ? ?  ? ?CHIEF COMPLAINT ?Patient presents for  ?Chief Complaint  ?Patient presents with  ? Macular Degeneration  ? ? ? ? ?HISTORY OF PRESENT ILLNESS: ?Shelley Sloan is a 88 y.o. female who presents to the clinic today for:  ? ?HPI   ?5 weeks for DILATE, OS, AVASTIN OCT. ?Pt stated, "Last night, I was reading in bed and my left eye got real cloudy. Soon after my last visit, I saw little black spots in my left eye but then it went away." ?Pt denies new floaters and FOL. ? ?Last edited by Angeline Slim on 04/28/2021 10:55 AM.  ?  ? ? ?Referring physician: ?Olivia Canter, MD ?[address] ?[address] ?[place],  [place] [address] ? ?HISTORICAL INFORMATION:  ? ?Selected notes from the MEDICAL RECORD NUMBER ?  ? ?Lab Results  ?Component Value Date  ? HGBA1C 5.8 (H) 04/07/2020  ?  ? ?CURRENT MEDICATIONS: ?No current outpatient medications on file. (Ophthalmic Drugs)  ? ?No current facility-administered medications for this visit. (Ophthalmic Drugs)  ? ?Current Outpatient Medications (Other)  ?Medication Sig  ? albuterol (VENTOLIN HFA) 108 (90 Base) MCG/ACT inhaler Inhale 1 puff into the lungs as needed for wheezing or shortness of breath.  ? amLODipine (NORVASC) 10 MG tablet Take 0.5 tablets (5 mg total) by mouth daily.  ? atorvastatin (LIPITOR) 10 MG tablet Take 2 tablets (20 mg total) by mouth daily.  ? cetirizine (ZYRTEC) 10 MG tablet Take 10 mg by mouth as needed.  ? cholecalciferol (VITAMIN D3) 25 MCG (1000 UNIT) tablet Take 1,000 Units by mouth daily.  ? clopidogrel (PLAVIX) 75 MG tablet Take 1 tablet (75 mg total) by mouth daily.  ? hydrocortisone 1 % ointment Apply 1 application topically as needed.  ? levothyroxine (SYNTHROID) 50 MCG tablet Take 50 mcg by mouth daily.  ? meclizine (ANTIVERT) 12.5 MG tablet Take 1 tablet (12.5 mg total) by mouth 3 (three) times daily as needed for dizziness.  ? zinc oxide 20 % ointment Apply 1 application topically as needed for irritation.  ? ?No current facility-administered  medications for this visit. (Other)  ? ? ? ? ?REVIEW OF SYSTEMS: ?ROS   ?Negative for: Constitutional, Gastrointestinal, Neurological, Skin, Genitourinary, Musculoskeletal, HENT, Endocrine, Cardiovascular, Eyes, Respiratory, Psychiatric, Allergic/Imm, Heme/Lymph ?Last edited by Angeline Slim on 04/28/2021 10:55 AM.  ?  ? ? ? ?ALLERGIES ?Allergies  ?Allergen Reactions  ? Sulfa Antibiotics Hives  ? ? ?PAST MEDICAL HISTORY ?Past Medical History:  ?Diagnosis Date  ? Arthritis   ? Hypertension   ? ?Past Surgical History:  ?Procedure Laterality Date  ? ABDOMINAL HYSTERECTOMY    ? JOINT REPLACEMENT Right 1995  ? JOINT REPLACEMENT Left 1997  ? ? ?FAMILY HISTORY ?History reviewed. No pertinent family history. ? ?SOCIAL HISTORY ?Social History  ? ?Tobacco Use  ? Smoking status: Former  ?  Packs/day: 0.50  ?  Years: 18.00  ?  Pack years: 9.00  ?  Types: Cigarettes  ?  Quit date: 42  ?  Years since quitting: 48.3  ? Smokeless tobacco: Never  ?Vaping Use  ? Vaping Use: Never used  ?Substance Use Topics  ? Alcohol use: Yes  ? Drug use: Never  ? ?  ? ?  ? ?OPHTHALMIC EXAM: ? ?Base Eye Exam   ? ? Visual Acuity (ETDRS)   ? ?   Right Left  ? Dist cc 20/20 -2 20/25 -1  ? ? Correction: Glasses  ? ?  ?  ? ?  Tonometry (Tonopen, 11:01 AM)   ? ?   Right Left  ? Pressure 12 13  ? ?  ?  ? ? Pupils   ? ?   Pupils APD  ? Right PERRL None  ? Left PERRL None  ? ?  ?  ? ? Visual Fields   ? ?   Left Right  ?  Full Full  ? ?  ?  ? ? Extraocular Movement   ? ?   Right Left  ?  Full Full  ? ?  ?  ? ? Neuro/Psych   ? ? Oriented x3: Yes  ? Mood/Affect: Normal  ? ?  ?  ? ? Dilation   ? ? Left eye: 2.5% Phenylephrine, 1.0% Mydriacyl @ 11:01 AM  ? ?  ?  ? ?  ? ?Slit Lamp and Fundus Exam   ? ? External Exam   ? ?   Right Left  ? External Normal Normal  ? ?  ?  ? ? Slit Lamp Exam   ? ?   Right Left  ? Lids/Lashes Normal Normal  ? Conjunctiva/Sclera White and quiet White and quiet  ? Cornea Clear Clear  ? Anterior Chamber Deep and quiet Deep and quiet  ? Iris  Round and reactive Round and reactive  ? Lens Posterior chamber intraocular lens Posterior chamber intraocular lens  ? Anterior Vitreous Normal Normal  ? ?  ?  ? ? Fundus Exam   ? ?   Right Left  ? Posterior Vitreous  Posterior vitreous detachment  ? Disc  Normal  ? C/D Ratio  0.55  ? Macula  Intermediate age related macular degeneration, Soft drusen, no macular thickening, no hemorrhage  ? Vessels  Normal  ? Periphery  Normal  ? ?  ?  ? ?  ? ? ?IMAGING AND PROCEDURES  ?Imaging and Procedures for 04/28/21 ? ?OCT, Retina - OU - Both Eyes   ? ?   ?Right Eye ?Quality was good. Scan locations included subfoveal. Central Foveal Thickness: 318. Progression has been stable. Findings include no SRF, no IRF, abnormal foveal contour, epiretinal membrane.  ? ?Left Eye ?Quality was good. Scan locations included subfoveal. Central Foveal Thickness: 276. Progression has improved. Findings include abnormal foveal contour, retinal drusen , no SRF.  ? ?Notes ?Small pigment epithelial detachment solid drusen change not active CNVM nasal to FAZ OD ? ?OS with resolved region of intraretinal fluid and CNVM formation superotemporal to FAZ post Avastin No. 2.  We will repeat injection today and maintain interval exam ? ?  ? ?Intravitreal Injection, Pharmacologic Agent - OS - Left Eye   ? ?   ?Time Out ?04/28/2021. 11:15 AM. Confirmed correct patient, procedure, site, and patient consented.  ? ?Anesthesia ?Topical anesthesia was used. Anesthetic medications included Lidocaine 4%.  ? ?Procedure ?Preparation included 5% betadine to ocular surface, 10% betadine to eyelids, Tobramycin 0.3%, Ofloxacin . A 30 gauge needle was used.  ? ?Injection: ?2.5 mg bevacizumab 2.5 MG/0.1ML ?  Route: Intravitreal, Site: Left Eye ?  NDC: 35456-256-38, Lot: [id number]  ? ?Post-op ?Post injection exam found visual acuity of at least counting fingers. The patient tolerated the procedure well. There were no complications. The patient received written and verbal post  procedure care education. Post injection medications included ocuflox.  ? ?  ? ? ?  ?  ? ?  ?ASSESSMENT/PLAN: ? ?Right epiretinal membrane ?Minor OD no change over time no impact on acuity observe ? ?Exudative age-related  macular degeneration of left eye with active choroidal neovascularization (HCC) ?Now post injection #2 Avastin doing extremely well.  Improved macular condition, much less intraretinal fluid and protected acuity, repeat injection today at current interval of 5 weeks, reevaluate in 6 weeks ? ?Intermediate stage nonexudative age-related macular degeneration of both eyes ?No sign of CNVM OD  ? ?  ICD-10-CM   ?1. Exudative age-related macular degeneration of left eye with active choroidal neovascularization (HCC)  H35.3221 OCT, Retina - OU - Both Eyes  ?  Intravitreal Injection, Pharmacologic Agent - OS - Left Eye  ?  bevacizumab (AVASTIN) SOSY 2.5 mg  ?  ?2. Right epiretinal membrane  H35.371   ?  ?3. Intermediate stage nonexudative age-related macular degeneration of both eyes  H35.3132   ?  ? ? ?1.  OS vastly improved overall post injection of 1 and 2 of Avastin.  Repeat injection today to maintain at 5-week interval.  Repeat injection evaluation next OS in 6 weeks ? ?2.  We will dilate OU next, to follow-up maintain evaluation and surveillance OD as well ? ?3. ? ?Ophthalmic Meds Ordered this visit:  ?Meds ordered this encounter  ?Medications  ? bevacizumab (AVASTIN) SOSY 2.5 mg  ? ? ?  ? ?Return in about 6 weeks (around 06/09/2021) for dilate, OS, AVASTIN OCT. ? ?There are no Patient Instructions on file for this visit. ? ? ?Explained the diagnoses, plan, and follow up with the patient and they expressed understanding.  Patient expressed understanding of the importance of proper follow up care.  ? ?Alford HighlandGary A. Cortavius Montesinos M.D. ?Diseases & Surgery of the Retina and Vitreous ?[facility] ?04/28/21 ? ? ? ? ?Abbreviations: ?M myopia (nearsighted); A astigmatism; H hyperopia (farsighted); P  presbyopia; Mrx spectacle prescription;  CTL contact lenses; OD right eye; OS left eye; OU both eyes  XT exotropia; ET esotropia; PEK punctate epithelial keratitis; PEE punctate epithelial erosions; DES dry ey

## 2021-04-28 NOTE — Assessment & Plan Note (Signed)
Minor OD no change over time no impact on acuity observe ?

## 2021-04-28 NOTE — Assessment & Plan Note (Signed)
Now post injection #2 Avastin doing extremely well.  Improved macular condition, much less intraretinal fluid and protected acuity, repeat injection today at current interval of 5 weeks, reevaluate in 6 weeks ?

## 2021-04-28 NOTE — Assessment & Plan Note (Signed)
No sign of CNVM OD 

## 2021-06-14 ENCOUNTER — Encounter (INDEPENDENT_AMBULATORY_CARE_PROVIDER_SITE_OTHER): Payer: Medicare Other | Admitting: Ophthalmology

## 2021-06-16 ENCOUNTER — Encounter (INDEPENDENT_AMBULATORY_CARE_PROVIDER_SITE_OTHER): Payer: Self-pay | Admitting: Ophthalmology

## 2021-06-16 ENCOUNTER — Ambulatory Visit (INDEPENDENT_AMBULATORY_CARE_PROVIDER_SITE_OTHER): Payer: Medicare Other | Admitting: Ophthalmology

## 2021-06-16 DIAGNOSIS — H353132 Nonexudative age-related macular degeneration, bilateral, intermediate dry stage: Secondary | ICD-10-CM

## 2021-06-16 DIAGNOSIS — H353221 Exudative age-related macular degeneration, left eye, with active choroidal neovascularization: Secondary | ICD-10-CM

## 2021-06-16 DIAGNOSIS — H35371 Puckering of macula, right eye: Secondary | ICD-10-CM | POA: Diagnosis not present

## 2021-06-16 MED ORDER — BEVACIZUMAB 2.5 MG/0.1ML IZ SOSY
2.5000 mg | PREFILLED_SYRINGE | INTRAVITREAL | Status: AC | PRN
  Administered 2021-06-16: 2.5 mg via INTRAVITREAL

## 2021-06-16 NOTE — Assessment & Plan Note (Signed)
Minor ERM OD

## 2021-06-16 NOTE — Progress Notes (Signed)
06/16/2021     CHIEF COMPLAINT Patient presents for  Chief Complaint  Patient presents with   Macular Degeneration      HISTORY OF PRESENT ILLNESS: Shelley Sloan is a 88 y.o. female who presents to the clinic today for:   HPI   7 weeks for DILATE, OS, AVATSIN OCT. Pt stated vision has gotten blurry since last visit.  Last edited by [name], San on 06/16/2021 10:41 AM.      Referring physician: Charlane FerrettiSkakle, [place], DO [address] [place] [id number]  HISTORICAL INFORMATION:   Selected notes from the MEDICAL RECORD NUMBER    Lab Results  Component Value Date   HGBA1C 5.8 (H) 04/07/2020     CURRENT MEDICATIONS: No current outpatient medications on file. (Ophthalmic Drugs)   No current facility-administered medications for this visit. (Ophthalmic Drugs)   Current Outpatient Medications (Other)  Medication Sig   albuterol (VENTOLIN HFA) 108 (90 Base) MCG/ACT inhaler Inhale 1 puff into the lungs as needed for wheezing or shortness of breath.   amLODipine (NORVASC) 10 MG tablet Take 0.5 tablets (5 mg total) by mouth daily.   atorvastatin (LIPITOR) 10 MG tablet Take 2 tablets (20 mg total) by mouth daily.   cetirizine (ZYRTEC) 10 MG tablet Take 10 mg by mouth as needed.   cholecalciferol (VITAMIN D3) 25 MCG (1000 UNIT) tablet Take 1,000 Units by mouth daily.   clopidogrel (PLAVIX) 75 MG tablet Take 1 tablet (75 mg total) by mouth daily.   hydrocortisone 1 % ointment Apply 1 application topically as needed.   levothyroxine (SYNTHROID) 50 MCG tablet Take 50 mcg by mouth daily.   meclizine (ANTIVERT) 12.5 MG tablet Take 1 tablet (12.5 mg total) by mouth 3 (three) times daily as needed for dizziness.   zinc oxide 20 % ointment Apply 1 application topically as needed for irritation.   No current facility-administered medications for this visit. (Other)      REVIEW OF SYSTEMS: ROS   Negative for: Constitutional, Gastrointestinal, Neurological, Skin, Genitourinary,  Musculoskeletal, HENT, Endocrine, Cardiovascular, Eyes, Respiratory, Psychiatric, Allergic/Imm, Heme/Lymph Last edited by [name], San on 06/16/2021 10:41 AM.       ALLERGIES Allergies  Allergen Reactions   Sulfa Antibiotics Hives    PAST MEDICAL HISTORY Past Medical History:  Diagnosis Date   Arthritis    Hypertension    Past Surgical History:  Procedure Laterality Date   ABDOMINAL HYSTERECTOMY     JOINT REPLACEMENT Right 1995   JOINT REPLACEMENT Left 1997    FAMILY HISTORY No family history on file.  SOCIAL HISTORY Social History   Tobacco Use   Smoking status: Former    Packs/day: 0.50    Years: 18.00    Total pack years: 9.00    Types: Cigarettes    Quit date: 1975    Years since quitting: 48.4   Smokeless tobacco: Never  Vaping Use   Vaping Use: Never used  Substance Use Topics   Alcohol use: Yes   Drug use: Never         OPHTHALMIC EXAM:  Base Eye Exam     Visual Acuity (ETDRS)       Right Left   Dist cc 20/50 -1 20/40   Dist ph [place] 20/40    Dist ph cc  20/30 -1    Correction: Glasses         Tonometry (Tonopen, 10:48 AM)       Right Left   Pressure 13 10  Pupils       Pupils APD   Right PERRL None   Left PERRL None         Visual Fields       Left Right    Full Full         Extraocular Movement       Right Left    Full Full         Neuro/Psych     Oriented x3: Yes   Mood/Affect: Normal         Dilation     Left eye: 2.5% Phenylephrine, 1.0% Mydriacyl @ 10:48 AM           Slit Lamp and Fundus Exam     External Exam       Right Left   External Normal Normal         Slit Lamp Exam       Right Left   Lids/Lashes Normal Normal   Conjunctiva/Sclera White and quiet White and quiet   Cornea Clear Clear   Anterior Chamber Deep and quiet Deep and quiet   Iris Round and reactive Round and reactive   Lens Posterior chamber intraocular lens Posterior chamber intraocular lens   Anterior  Vitreous Normal Normal         Fundus Exam       Right Left   Posterior Vitreous  Posterior vitreous detachment   Disc  Normal   C/D Ratio  0.55   Macula  Intermediate age related macular degeneration, Soft drusen, no macular thickening, no hemorrhage   Vessels  Normal   Periphery  Normal            IMAGING AND PROCEDURES  Imaging and Procedures for 06/16/21  OCT, Retina - OU - Both Eyes       Right Eye Quality was good. Scan locations included subfoveal. Central Foveal Thickness: 318. Progression has been stable. Findings include no IRF, no SRF, abnormal foveal contour, epiretinal membrane.   Left Eye Quality was good. Scan locations included subfoveal. Central Foveal Thickness: 276. Progression has improved. Findings include no SRF, abnormal foveal contour, retinal drusen .   Notes Small pigment epithelial detachment solid drusen change not active CNVM nasal to FAZ OD, epiretinal membrane no topo distortion of the fovea OD  OS with resolved region of intraretinal fluid and CNVM formation superotemporal to FAZ post Avastin No. 2.  We will repeat injection today and maintain interval exam     Intravitreal Injection, Pharmacologic Agent - OS - Left Eye       Time Out 06/16/2021. 12:05 PM. Confirmed correct patient, procedure, site, and patient consented.   Anesthesia Topical anesthesia was used. Anesthetic medications included Lidocaine 4%.   Procedure Preparation included 5% betadine to ocular surface, 10% betadine to eyelids, Tobramycin 0.3%, Ofloxacin . A 30 gauge needle was used.   Injection: 2.5 mg bevacizumab 2.5 MG/0.1ML   Route: Intravitreal, Site: Left Eye   NDC: [id number], Lot: [id number], Expiration date: 08/01/2021   Post-op Post injection exam found visual acuity of at least counting fingers. The patient tolerated the procedure well. There were no complications. The patient received written and verbal post procedure care education. Post injection  medications included ocuflox.              ASSESSMENT/PLAN:  Intermediate stage nonexudative age-related macular degeneration of both eyes No sign of CNVM OD  Right epiretinal membrane Minor ERM OD  Exudative age-related macular degeneration of left eye with  active choroidal neovascularization (HCC) OS doing very well as compared to onset of wet AMD February 2023.  Currently at 7-week interval.  Repeat injection today at 7 weeks and will maintain interval of 7 weeks      ICD-10-CM   1. Exudative age-related macular degeneration of left eye with active choroidal neovascularization (HCC)  H35.3221 OCT, Retina - OU - Both Eyes    Intravitreal Injection, Pharmacologic Agent - OS - Left Eye    bevacizumab (AVASTIN) SOSY 2.5 mg    2. Intermediate stage nonexudative age-related macular degeneration of both eyes  H35.3132     3. Right epiretinal membrane  H35.371       1.  OS, vastly improved stabilized wet AMD.  Currently at 7-week follow-up.  Repeat injection today to maintain and extend interval examination next 6 to 8 weeks  2.  OD epiretinal membrane minor continue to observe  3.  Ophthalmic Meds Ordered this visit:  Meds ordered this encounter  Medications   bevacizumab (AVASTIN) SOSY 2.5 mg       Return in about 7 weeks (around 08/04/2021) for dilate, OS, AVASTIN OCT.  There are no Patient Instructions on file for this visit.   Explained the diagnoses, plan, and follow up with the patient and they expressed understanding.  Patient expressed understanding of the importance of proper follow up care.   Alford Highland Chanon Loney M.D. Diseases & Surgery of the Retina and Vitreous [facility] 06/16/21     Abbreviations: M myopia (nearsighted); A astigmatism; H hyperopia (farsighted); P presbyopia; Mrx spectacle prescription;  CTL contact lenses; OD right eye; OS left eye; OU both eyes  XT exotropia; ET esotropia; PEK punctate epithelial keratitis; PEE  punctate epithelial erosions; DES dry eye syndrome; MGD meibomian gland dysfunction; ATs artificial tears; PFAT's preservative free artificial tears; NSC nuclear sclerotic cataract; PSC posterior subcapsular cataract; ERM epi-retinal membrane; PVD posterior vitreous detachment; RD retinal detachment; DM diabetes mellitus; DR diabetic retinopathy; NPDR non-proliferative diabetic retinopathy; PDR proliferative diabetic retinopathy; CSME clinically significant macular edema; DME diabetic macular edema; dbh dot blot hemorrhages; CWS cotton wool spot; POAG primary open angle glaucoma; C/D cup-to-disc ratio; HVF humphrey visual field; GVF goldmann visual field; OCT optical coherence tomography; IOP intraocular pressure; BRVO Branch retinal vein occlusion; CRVO central retinal vein occlusion; CRAO central retinal artery occlusion; BRAO branch retinal artery occlusion; RT retinal tear; SB scleral buckle; PPV pars plana vitrectomy; VH Vitreous hemorrhage; PRP panretinal laser photocoagulation; IVK intravitreal kenalog; VMT vitreomacular traction; MH Macular hole;  NVD neovascularization of the disc; NVE neovascularization elsewhere; AREDS age related eye disease study; ARMD age related macular degeneration; POAG primary open angle glaucoma; EBMD epithelial/anterior basement membrane dystrophy; ACIOL anterior chamber intraocular lens; IOL intraocular lens; PCIOL posterior chamber intraocular lens; Phaco/IOL phacoemulsification with intraocular lens placement; PRK photorefractive keratectomy; LASIK laser assisted in situ keratomileusis; HTN hypertension; DM diabetes mellitus; COPD chronic obstructive pulmonary disease

## 2021-06-16 NOTE — Assessment & Plan Note (Signed)
No sign of CNVM OD 

## 2021-06-16 NOTE — Assessment & Plan Note (Signed)
OS doing very well as compared to onset of wet AMD February 2023.  Currently at 7-week interval.  Repeat injection today at 7 weeks and will maintain interval of 7 weeks

## 2021-08-04 ENCOUNTER — Ambulatory Visit (INDEPENDENT_AMBULATORY_CARE_PROVIDER_SITE_OTHER): Payer: Medicare Other | Admitting: Ophthalmology

## 2021-08-04 ENCOUNTER — Encounter (INDEPENDENT_AMBULATORY_CARE_PROVIDER_SITE_OTHER): Payer: Self-pay | Admitting: Ophthalmology

## 2021-08-04 DIAGNOSIS — H353221 Exudative age-related macular degeneration, left eye, with active choroidal neovascularization: Secondary | ICD-10-CM

## 2021-08-04 DIAGNOSIS — H353132 Nonexudative age-related macular degeneration, bilateral, intermediate dry stage: Secondary | ICD-10-CM

## 2021-08-04 DIAGNOSIS — H35371 Puckering of macula, right eye: Secondary | ICD-10-CM

## 2021-08-04 MED ORDER — BEVACIZUMAB CHEMO INJECTION 1.25MG/0.05ML SYRINGE FOR KALEIDOSCOPE
1.2500 mg | INTRAVITREAL | Status: AC | PRN
  Administered 2021-08-04: 1.25 mg via INTRAVITREAL

## 2021-08-04 NOTE — Assessment & Plan Note (Signed)
Doing very well now with much less subretinal fluid and less intraretinal fluid in the foveal region left eye currently at 7-week interval.  Repeat injection today maintain 7-week interval

## 2021-08-04 NOTE — Assessment & Plan Note (Signed)
Minor OD, monitored by OCT no change over time no impact on acuity

## 2021-08-04 NOTE — Progress Notes (Signed)
08/04/2021     CHIEF COMPLAINT Patient presents for  Chief Complaint  Patient presents with   Macular Degeneration   Branch Retinal Vein Occlusion      HISTORY OF PRESENT ILLNESS: Shelley Sloan is a 88 y.o. female who presents to the clinic today for:   HPI   Pt states her vision has been stable  Pt denies any new floaters or FOL  Last edited by Aleene Davidson, CMA on 08/04/2021 10:54 AM.      Referring physician: Charlane Ferretti, DO [address] [place],  [place] [address]  HISTORICAL INFORMATION:   Selected notes from the MEDICAL RECORD NUMBER    Lab Results  Component Value Date   HGBA1C 5.8 (H) 04/07/2020     CURRENT MEDICATIONS: No current outpatient medications on file. (Ophthalmic Drugs)   No current facility-administered medications for this visit. (Ophthalmic Drugs)   Current Outpatient Medications (Other)  Medication Sig   albuterol (VENTOLIN HFA) 108 (90 Base) MCG/ACT inhaler Inhale 1 puff into the lungs as needed for wheezing or shortness of breath.   amLODipine (NORVASC) 10 MG tablet Take 0.5 tablets (5 mg total) by mouth daily.   atorvastatin (LIPITOR) 10 MG tablet Take 2 tablets (20 mg total) by mouth daily.   cetirizine (ZYRTEC) 10 MG tablet Take 10 mg by mouth as needed.   cholecalciferol (VITAMIN D3) 25 MCG (1000 UNIT) tablet Take 1,000 Units by mouth daily.   clopidogrel (PLAVIX) 75 MG tablet Take 1 tablet (75 mg total) by mouth daily.   hydrocortisone 1 % ointment Apply 1 application topically as needed.   levothyroxine (SYNTHROID) 50 MCG tablet Take 50 mcg by mouth daily.   meclizine (ANTIVERT) 12.5 MG tablet Take 1 tablet (12.5 mg total) by mouth 3 (three) times daily as needed for dizziness.   zinc oxide 20 % ointment Apply 1 application topically as needed for irritation.   No current facility-administered medications for this visit. (Other)      REVIEW OF SYSTEMS: ROS   Negative for: Constitutional, Gastrointestinal, Neurological,  Skin, Genitourinary, Musculoskeletal, HENT, Endocrine, Cardiovascular, Eyes, Respiratory, Psychiatric, Allergic/Imm, Heme/Lymph Last edited by Edmon Crape, MD on 08/04/2021 11:27 AM.       ALLERGIES Allergies  Allergen Reactions   Sulfa Antibiotics Hives    PAST MEDICAL HISTORY Past Medical History:  Diagnosis Date   Arthritis    Hypertension    Past Surgical History:  Procedure Laterality Date   ABDOMINAL HYSTERECTOMY     JOINT REPLACEMENT Right 1995   JOINT REPLACEMENT Left 1997    FAMILY HISTORY History reviewed. No pertinent family history.  SOCIAL HISTORY Social History   Tobacco Use   Smoking status: Former    Packs/day: 0.50    Years: 18.00    Total pack years: 9.00    Types: Cigarettes    Quit date: 1975    Years since quitting: 48.6   Smokeless tobacco: Never  Vaping Use   Vaping Use: Never used  Substance Use Topics   Alcohol use: Yes   Drug use: Never         OPHTHALMIC EXAM:  Base Eye Exam     Visual Acuity (ETDRS)       Right Left   Dist cc 20/20 20/30 +2         Tonometry (Tonopen, 11:01 AM)       Right Left   Pressure 17 16         Pupils  Pupils APD   Right PERRL None   Left PERRL None         Visual Fields       Left Right    Full Full         Extraocular Movement       Right Left    Full, Ortho Full, Ortho         Neuro/Psych     Oriented x3: Yes   Mood/Affect: Normal         Dilation     Left eye: 2.5% Phenylephrine, 1.0% Mydriacyl @ 10:56 AM           Slit Lamp and Fundus Exam     External Exam       Right Left   External Normal Normal         Slit Lamp Exam       Right Left   Lids/Lashes Normal Normal   Conjunctiva/Sclera White and quiet White and quiet   Cornea Clear Clear   Anterior Chamber Deep and quiet Deep and quiet   Iris Round and reactive Round and reactive   Lens Posterior chamber intraocular lens Posterior chamber intraocular lens   Anterior Vitreous  Normal Normal         Fundus Exam       Right Left   Posterior Vitreous  Posterior vitreous detachment   Disc  Normal   C/D Ratio  0.55   Macula  Intermediate age related macular degeneration, Soft drusen, no macular thickening, no hemorrhage   Vessels  Normal   Periphery  Normal            IMAGING AND PROCEDURES  Imaging and Procedures for 08/04/21  OCT, Retina - OU - Both Eyes       Right Eye Quality was poor. Scan locations included subfoveal. Progression has been stable. Findings include no IRF, no SRF, abnormal foveal contour, epiretinal membrane.   Left Eye Quality was good. Scan locations included subfoveal. Central Foveal Thickness: 272. Progression has improved. Findings include no SRF, abnormal foveal contour, retinal drusen .   Notes Small pigment epithelial detachment solid drusen change not active CNVM nasal to FAZ OD, epiretinal membrane no topo distortion of the fovea OD  OS with resolved region of intraretinal fluid and CNVM formation superotemporal to FAZ post Avastin since February 2023.  We will repeat injection today and maintain interval exam     Intravitreal Injection, Pharmacologic Agent - OS - Left Eye       Time Out 08/04/2021. 11:35 AM. Confirmed correct patient, procedure, site, and patient consented.   Anesthesia Topical anesthesia was used. Anesthetic medications included Lidocaine 4%.   Procedure Preparation included 5% betadine to ocular surface, 10% betadine to eyelids, Tobramycin 0.3%, Ofloxacin . A 30 gauge needle was used.   Injection: 1.25 mg Bevacizumab 1.25mg /0.24ml   Route: Intravitreal, Site: Left Eye   NDC: [id number]   Post-op Post injection exam found visual acuity of at least counting fingers. The patient tolerated the procedure well. There were no complications. The patient received written and verbal post procedure care education. Post injection medications included ocuflox.               ASSESSMENT/PLAN:  Exudative age-related macular degeneration of left eye with active choroidal neovascularization (HCC) Doing very well now with much less subretinal fluid and less intraretinal fluid in the foveal region left eye currently at 7-week interval.  Repeat injection today maintain 7-week interval  Intermediate  stage nonexudative age-related macular degeneration of both eyes OD stable no sign of CNVM continue to monitor  Right epiretinal membrane Minor OD, monitored by OCT no change over time no impact on acuity     ICD-10-CM   1. Exudative age-related macular degeneration of left eye with active choroidal neovascularization (HCC)  H35.3221 OCT, Retina - OU - Both Eyes    Intravitreal Injection, Pharmacologic Agent - OS - Left Eye    Bevacizumab (AVASTIN) SOLN 1.25 mg    2. Intermediate stage nonexudative age-related macular degeneration of both eyes  H35.3132     3. Right epiretinal membrane  H35.371       1.  2.  3.  Ophthalmic Meds Ordered this visit:  Meds ordered this encounter  Medications   Bevacizumab (AVASTIN) SOLN 1.25 mg       Return in about 7 weeks (around 09/22/2021) for dilate, OS, AVASTIN OCT.  There are no Patient Instructions on file for this visit.   Explained the diagnoses, plan, and follow up with the patient and they expressed understanding.  Patient expressed understanding of the importance of proper follow up care.   Alford Highland Sharlette Jansma M.D. Diseases & Surgery of the Retina and Vitreous [facility] 08/04/21     Abbreviations: M myopia (nearsighted); A astigmatism; H hyperopia (farsighted); P presbyopia; Mrx spectacle prescription;  CTL contact lenses; OD right eye; OS left eye; OU both eyes  XT exotropia; ET esotropia; PEK punctate epithelial keratitis; PEE punctate epithelial erosions; DES dry eye syndrome; MGD meibomian gland dysfunction; ATs artificial tears; PFAT's preservative free artificial tears; NSC nuclear  sclerotic cataract; PSC posterior subcapsular cataract; ERM epi-retinal membrane; PVD posterior vitreous detachment; RD retinal detachment; DM diabetes mellitus; DR diabetic retinopathy; NPDR non-proliferative diabetic retinopathy; PDR proliferative diabetic retinopathy; CSME clinically significant macular edema; DME diabetic macular edema; dbh dot blot hemorrhages; CWS cotton wool spot; POAG primary open angle glaucoma; C/D cup-to-disc ratio; HVF humphrey visual field; GVF goldmann visual field; OCT optical coherence tomography; IOP intraocular pressure; BRVO Branch retinal vein occlusion; CRVO central retinal vein occlusion; CRAO central retinal artery occlusion; BRAO branch retinal artery occlusion; RT retinal tear; SB scleral buckle; PPV pars plana vitrectomy; VH Vitreous hemorrhage; PRP panretinal laser photocoagulation; IVK intravitreal kenalog; VMT vitreomacular traction; MH Macular hole;  NVD neovascularization of the disc; NVE neovascularization elsewhere; AREDS age related eye disease study; ARMD age related macular degeneration; POAG primary open angle glaucoma; EBMD epithelial/anterior basement membrane dystrophy; ACIOL anterior chamber intraocular lens; IOL intraocular lens; PCIOL posterior chamber intraocular lens; Phaco/IOL phacoemulsification with intraocular lens placement; PRK photorefractive keratectomy; LASIK laser assisted in situ keratomileusis; HTN hypertension; DM diabetes mellitus; COPD chronic obstructive pulmonary disease

## 2021-08-04 NOTE — Assessment & Plan Note (Signed)
OD stable no sign of CNVM continue to monitor

## 2021-09-22 ENCOUNTER — Ambulatory Visit (INDEPENDENT_AMBULATORY_CARE_PROVIDER_SITE_OTHER): Payer: Medicare Other | Admitting: Ophthalmology

## 2021-09-22 ENCOUNTER — Encounter (INDEPENDENT_AMBULATORY_CARE_PROVIDER_SITE_OTHER): Payer: Self-pay | Admitting: Ophthalmology

## 2021-09-22 DIAGNOSIS — H353221 Exudative age-related macular degeneration, left eye, with active choroidal neovascularization: Secondary | ICD-10-CM

## 2021-09-22 DIAGNOSIS — H35371 Puckering of macula, right eye: Secondary | ICD-10-CM | POA: Diagnosis not present

## 2021-09-22 DIAGNOSIS — H353132 Nonexudative age-related macular degeneration, bilateral, intermediate dry stage: Secondary | ICD-10-CM

## 2021-09-22 MED ORDER — BEVACIZUMAB CHEMO INJECTION 1.25MG/0.05ML SYRINGE FOR KALEIDOSCOPE
2.5000 mg | INTRAVITREAL | Status: AC | PRN
  Administered 2021-09-22: 2.5 mg via INTRAVITREAL

## 2021-09-22 NOTE — Assessment & Plan Note (Signed)
Minor no change by OCT

## 2021-09-22 NOTE — Assessment & Plan Note (Signed)
Today at 7 weeks postinjection and doing very well.  No sign of recurrence.  Stable acuity.  Repeat Avastin today and reevaluate next in 8 weeks

## 2021-09-22 NOTE — Progress Notes (Signed)
09/22/2021     CHIEF COMPLAINT Patient presents for  Chief Complaint  Patient presents with   Macular Degeneration      HISTORY OF PRESENT ILLNESS: Shelley Sloan is a 88 y.o. female who presents to the clinic today for:   HPI   Age related macular degeneration of left eye 7 weeks dilate os avastin oct Pt states her vision has been stable Pt denies any new floaters or FOL Last edited by Aleene Davidson, CMA on 09/22/2021 11:05 AM.      Referring physician: Charlane Ferretti, DO [address] [place],  [place] [address]  HISTORICAL INFORMATION:   Selected notes from the MEDICAL RECORD NUMBER    Lab Results  Component Value Date   HGBA1C 5.8 (H) 04/07/2020     CURRENT MEDICATIONS: No current outpatient medications on file. (Ophthalmic Drugs)   No current facility-administered medications for this visit. (Ophthalmic Drugs)   Current Outpatient Medications (Other)  Medication Sig   albuterol (VENTOLIN HFA) 108 (90 Base) MCG/ACT inhaler Inhale 1 puff into the lungs as needed for wheezing or shortness of breath.   amLODipine (NORVASC) 10 MG tablet Take 0.5 tablets (5 mg total) by mouth daily.   atorvastatin (LIPITOR) 10 MG tablet Take 2 tablets (20 mg total) by mouth daily.   cetirizine (ZYRTEC) 10 MG tablet Take 10 mg by mouth as needed.   cholecalciferol (VITAMIN D3) 25 MCG (1000 UNIT) tablet Take 1,000 Units by mouth daily.   clopidogrel (PLAVIX) 75 MG tablet Take 1 tablet (75 mg total) by mouth daily.   hydrocortisone 1 % ointment Apply 1 application topically as needed.   levothyroxine (SYNTHROID) 50 MCG tablet Take 50 mcg by mouth daily.   meclizine (ANTIVERT) 12.5 MG tablet Take 1 tablet (12.5 mg total) by mouth 3 (three) times daily as needed for dizziness.   zinc oxide 20 % ointment Apply 1 application topically as needed for irritation.   No current facility-administered medications for this visit. (Other)      REVIEW OF SYSTEMS: ROS   Negative for:  Constitutional, Gastrointestinal, Neurological, Skin, Genitourinary, Musculoskeletal, HENT, Endocrine, Cardiovascular, Eyes, Respiratory, Psychiatric, Allergic/Imm, Heme/Lymph Last edited by Aleene Davidson, CMA on 09/22/2021 11:05 AM.       ALLERGIES Allergies  Allergen Reactions   Sulfa Antibiotics Hives    PAST MEDICAL HISTORY Past Medical History:  Diagnosis Date   Arthritis    Hypertension    Past Surgical History:  Procedure Laterality Date   ABDOMINAL HYSTERECTOMY     JOINT REPLACEMENT Right 1995   JOINT REPLACEMENT Left 1997    FAMILY HISTORY History reviewed. No pertinent family history.  SOCIAL HISTORY Social History   Tobacco Use   Smoking status: Former    Packs/day: 0.50    Years: 18.00    Total pack years: 9.00    Types: Cigarettes    Quit date: 1975    Years since quitting: 48.7   Smokeless tobacco: Never  Vaping Use   Vaping Use: Never used  Substance Use Topics   Alcohol use: Yes   Drug use: Never         OPHTHALMIC EXAM:  Base Eye Exam     Visual Acuity (ETDRS)       Right Left   Dist cc 20/20 -2 20/25 +3    Correction: Glasses         Tonometry (Tonopen, 11:10 AM)       Right Left   Pressure 8 7  Pupils       Pupils APD   Right PERRL None   Left PERRL None         Visual Fields       Left Right    Full Full         Extraocular Movement       Right Left    Ortho Ortho    -- -- --  --  --  -- -- --   -- -- --  --  --  -- -- --           Neuro/Psych     Oriented x3: Yes   Mood/Affect: Normal         Dilation     Left eye: 2.5% Phenylephrine, 1.0% Mydriacyl @ 11:07 AM           Slit Lamp and Fundus Exam     External Exam       Right Left   External Normal Normal         Slit Lamp Exam       Right Left   Lids/Lashes Normal Normal   Conjunctiva/Sclera White and quiet White and quiet   Cornea Clear Clear   Anterior Chamber Deep and quiet Deep and quiet   Iris  Round and reactive Round and reactive   Lens Posterior chamber intraocular lens Posterior chamber intraocular lens   Anterior Vitreous Normal Normal         Fundus Exam       Right Left   Posterior Vitreous  Posterior vitreous detachment   Disc  Normal   C/D Ratio  0.55   Macula  Intermediate age related macular degeneration, Soft drusen, no macular thickening, no hemorrhage   Vessels  Normal   Periphery  Normal            IMAGING AND PROCEDURES  Imaging and Procedures for 09/22/21  OCT, Retina - OU - Both Eyes       Right Eye Quality was poor. Scan locations included subfoveal. Central Foveal Thickness: 352. Progression has been stable. Findings include no IRF, no SRF, abnormal foveal contour, epiretinal membrane.   Left Eye Quality was good. Scan locations included subfoveal. Central Foveal Thickness: 278. Progression has improved. Findings include no SRF, abnormal foveal contour, retinal drusen .   Notes Small pigment epithelial detachment solid drusen change not active CNVM nasal to FAZ OD, epiretinal membrane no topo distortion of the fovea OD  OS with resolved region of intraretinal fluid and CNVM formation superotemporal to FAZ post Avastin since February 2023.  We will repeat injection today and at 7-week follow-up today and extend interval examination next      Intravitreal Injection, Pharmacologic Agent - OS - Left Eye       Time Out 09/22/2021. 12:15 PM. Confirmed correct patient, procedure, site, and patient consented.   Anesthesia Topical anesthesia was used. Anesthetic medications included Lidocaine 4%.   Procedure Preparation included 5% betadine to ocular surface, 10% betadine to eyelids, Tobramycin 0.3%, Ofloxacin . A 30 gauge needle was used.   Injection: 2.5 mg Bevacizumab 1.25mg /0.44ml   Route: Intravitreal, Site: Left Eye   NDC: [id number]   Post-op Post injection exam found visual acuity of at least counting fingers. The patient  tolerated the procedure well. There were no complications. The patient received written and verbal post procedure care education. Post injection medications included ocuflox.              ASSESSMENT/PLAN:  Exudative age-related macular degeneration of left eye with active choroidal neovascularization (HCC) Today at 7 weeks postinjection and doing very well.  No sign of recurrence.  Stable acuity.  Repeat Avastin today and reevaluate next in 8 weeks  Right epiretinal membrane Minor no change by OCT  Intermediate stage nonexudative age-related macular degeneration of both eyes No sign of CNVM OD     ICD-10-CM   1. Exudative age-related macular degeneration of left eye with active choroidal neovascularization (HCC)  H35.3221 OCT, Retina - OU - Both Eyes    Intravitreal Injection, Pharmacologic Agent - OS - Left Eye    Bevacizumab (AVASTIN) SOLN 2.5 mg    2. Right epiretinal membrane  H35.371     3. Intermediate stage nonexudative age-related macular degeneration of both eyes  H35.3132       1.  OS doing well.  Improved at 7-week interval no sign of persistence or recurrence of CNVM.  Repeat injection today reevaluate next in 8 weeks  2.  OD doing well no change no sign of CNVM formation no progression of ERM  3.  Ophthalmic Meds Ordered this visit:  Meds ordered this encounter  Medications   Bevacizumab (AVASTIN) SOLN 2.5 mg       Return in about 8 weeks (around 11/17/2021) for DILATE OU, AVASTIN OCT, OS.  There are no Patient Instructions on file for this visit.   Explained the diagnoses, plan, and follow up with the patient and they expressed understanding.  Patient expressed understanding of the importance of proper follow up care.   Clent Demark Senay Sistrunk M.D. Diseases & Surgery of the Retina and Vitreous [facility] 09/22/21     Abbreviations: M myopia (nearsighted); A astigmatism; H hyperopia (farsighted); P presbyopia; Mrx spectacle  prescription;  CTL contact lenses; OD right eye; OS left eye; OU both eyes  XT exotropia; ET esotropia; PEK punctate epithelial keratitis; PEE punctate epithelial erosions; DES dry eye syndrome; MGD meibomian gland dysfunction; ATs artificial tears; PFAT's preservative free artificial tears; [place] nuclear sclerotic cataract; PSC posterior subcapsular cataract; ERM epi-retinal membrane; PVD posterior vitreous detachment; RD retinal detachment; DM diabetes mellitus; DR diabetic retinopathy; NPDR non-proliferative diabetic retinopathy; PDR proliferative diabetic retinopathy; CSME clinically significant macular edema; DME diabetic macular edema; dbh dot blot hemorrhages; CWS cotton wool spot; POAG primary open angle glaucoma; C/D cup-to-disc ratio; HVF humphrey visual field; GVF goldmann visual field; OCT optical coherence tomography; IOP intraocular pressure; BRVO Branch retinal vein occlusion; CRVO central retinal vein occlusion; CRAO central retinal artery occlusion; BRAO branch retinal artery occlusion; RT retinal tear; SB scleral buckle; PPV pars plana vitrectomy; VH Vitreous hemorrhage; PRP panretinal laser photocoagulation; IVK intravitreal kenalog; VMT vitreomacular traction; MH Macular hole;  NVD neovascularization of the disc; NVE neovascularization elsewhere; AREDS age related eye disease study; ARMD age related macular degeneration; POAG primary open angle glaucoma; EBMD epithelial/anterior basement membrane dystrophy; ACIOL anterior chamber intraocular lens; IOL intraocular lens; PCIOL posterior chamber intraocular lens; Phaco/IOL phacoemulsification with intraocular lens placement; [place] photorefractive keratectomy; LASIK laser assisted in situ keratomileusis; HTN hypertension; DM diabetes mellitus; COPD chronic obstructive pulmonary disease

## 2021-09-22 NOTE — Assessment & Plan Note (Signed)
No sign of CNVM OD 

## 2021-11-12 ENCOUNTER — Ambulatory Visit (HOSPITAL_COMMUNITY)
Admission: RE | Admit: 2021-11-12 | Discharge: 2021-11-12 | Disposition: A | Discharge status: Home / Self Care | Payer: Medicare Other | Source: Ambulatory Visit | Attending: Vascular Surgery | Admitting: Vascular Surgery

## 2021-11-12 ENCOUNTER — Other Ambulatory Visit (HOSPITAL_COMMUNITY): Payer: Self-pay | Admitting: Adult Health

## 2021-11-12 DIAGNOSIS — R609 Edema, unspecified: Secondary | ICD-10-CM | POA: Diagnosis not present

## 2021-11-17 ENCOUNTER — Encounter (INDEPENDENT_AMBULATORY_CARE_PROVIDER_SITE_OTHER): Payer: Medicare Other | Admitting: Ophthalmology

## 2022-08-19 ENCOUNTER — Emergency Department (HOSPITAL_COMMUNITY): Payer: Medicare Other

## 2022-08-19 ENCOUNTER — Other Ambulatory Visit: Payer: Self-pay

## 2022-08-19 ENCOUNTER — Encounter (HOSPITAL_COMMUNITY): Payer: Self-pay | Admitting: *Deleted

## 2022-08-19 ENCOUNTER — Emergency Department (HOSPITAL_COMMUNITY)
Admission: EM | Admit: 2022-08-19 | Discharge: 2022-08-19 | Disposition: A | Discharge status: Home / Self Care | Payer: Medicare Other | Attending: Emergency Medicine | Admitting: Emergency Medicine

## 2022-08-19 DIAGNOSIS — Z79899 Other long term (current) drug therapy: Secondary | ICD-10-CM | POA: Diagnosis not present

## 2022-08-19 DIAGNOSIS — Z8673 Personal history of transient ischemic attack (TIA), and cerebral infarction without residual deficits: Secondary | ICD-10-CM | POA: Diagnosis not present

## 2022-08-19 DIAGNOSIS — Z79890 Hormone replacement therapy (postmenopausal): Secondary | ICD-10-CM | POA: Diagnosis not present

## 2022-08-19 DIAGNOSIS — I10 Essential (primary) hypertension: Secondary | ICD-10-CM | POA: Diagnosis not present

## 2022-08-19 DIAGNOSIS — R42 Dizziness and giddiness: Secondary | ICD-10-CM | POA: Diagnosis not present

## 2022-08-19 DIAGNOSIS — E039 Hypothyroidism, unspecified: Secondary | ICD-10-CM | POA: Diagnosis not present

## 2022-08-19 DIAGNOSIS — Z7902 Long term (current) use of antithrombotics/antiplatelets: Secondary | ICD-10-CM | POA: Diagnosis not present

## 2022-08-19 DIAGNOSIS — R531 Weakness: Secondary | ICD-10-CM | POA: Diagnosis present

## 2022-08-19 DIAGNOSIS — R0609 Other forms of dyspnea: Secondary | ICD-10-CM | POA: Diagnosis not present

## 2022-08-19 DIAGNOSIS — Z1152 Encounter for screening for COVID-19: Secondary | ICD-10-CM | POA: Diagnosis not present

## 2022-08-19 LAB — URINALYSIS, W/ REFLEX TO CULTURE (INFECTION SUSPECTED)
Bilirubin Urine: NEGATIVE
Glucose, UA: NEGATIVE mg/dL
Hgb urine dipstick: NEGATIVE
Ketones, ur: NEGATIVE mg/dL
Leukocytes,Ua: NEGATIVE
Nitrite: NEGATIVE
Protein, ur: NEGATIVE mg/dL
Specific Gravity, Urine: 1.012 (ref 1.005–1.030)
pH: 7 (ref 5.0–8.0)

## 2022-08-19 LAB — CBC WITH DIFFERENTIAL/PLATELET
Abs Immature Granulocytes: 0.02 10*3/uL (ref 0.00–0.07)
Basophils Absolute: 0.1 10*3/uL (ref 0.0–0.1)
Basophils Relative: 1 %
Eosinophils Absolute: 0.2 10*3/uL (ref 0.0–0.5)
Eosinophils Relative: 3 %
HCT: 41.4 % (ref 36.0–46.0)
Hemoglobin: 13 g/dL (ref 12.0–15.0)
Immature Granulocytes: 0 %
Lymphocytes Relative: 22 %
Lymphs Abs: 1.6 10*3/uL (ref 0.7–4.0)
MCH: 29.7 pg (ref 26.0–34.0)
MCHC: 31.4 g/dL (ref 30.0–36.0)
MCV: 94.7 fL (ref 80.0–100.0)
Monocytes Absolute: 0.5 10*3/uL (ref 0.1–1.0)
Monocytes Relative: 7 %
Neutro Abs: 4.8 10*3/uL (ref 1.7–7.7)
Neutrophils Relative %: 67 %
Platelets: 235 10*3/uL (ref 150–400)
RBC: 4.37 MIL/uL (ref 3.87–5.11)
RDW: 14.3 % (ref 11.5–15.5)
WBC: 7.2 10*3/uL (ref 4.0–10.5)
nRBC: 0 % (ref 0.0–0.2)

## 2022-08-19 LAB — BRAIN NATRIURETIC PEPTIDE: B Natriuretic Peptide: 30.7 pg/mL (ref 0.0–100.0)

## 2022-08-19 LAB — COMPREHENSIVE METABOLIC PANEL
ALT: 9 U/L (ref 0–44)
AST: 26 U/L (ref 15–41)
Albumin: 4.1 g/dL (ref 3.5–5.0)
Alkaline Phosphatase: 106 U/L (ref 38–126)
Anion gap: 9 (ref 5–15)
BUN: 24 mg/dL — ABNORMAL HIGH (ref 8–23)
CO2: 26 mmol/L (ref 22–32)
Calcium: 8.9 mg/dL (ref 8.9–10.3)
Chloride: 103 mmol/L (ref 98–111)
Creatinine, Ser: 0.73 mg/dL (ref 0.44–1.00)
GFR, Estimated: >60 mL/min (ref >60)
Glucose, Bld: 106 mg/dL — ABNORMAL HIGH (ref 70–99)
Potassium: 3.7 mmol/L (ref 3.5–5.1)
Sodium: 138 mmol/L (ref 135–145)
Total Bilirubin: 0.6 mg/dL (ref 0.3–1.2)
Total Protein: 7.4 g/dL (ref 6.5–8.1)

## 2022-08-19 LAB — I-STAT CHEM 8, ED
BUN: 24 mg/dL — ABNORMAL HIGH (ref 8–23)
Calcium, Ion: 1.16 mmol/L (ref 1.15–1.40)
Chloride: 105 mmol/L (ref 98–111)
Creatinine, Ser: 0.7 mg/dL (ref 0.44–1.00)
Glucose, Bld: 109 mg/dL — ABNORMAL HIGH (ref 70–99)
HCT: 38 % (ref 36.0–46.0)
Hemoglobin: 12.9 g/dL (ref 12.0–15.0)
Potassium: 4 mmol/L (ref 3.5–5.1)
Sodium: 141 mmol/L (ref 135–145)
TCO2: 27 mmol/L (ref 22–32)

## 2022-08-19 LAB — TROPONIN I (HIGH SENSITIVITY): Troponin I (High Sensitivity): 5 ng/L (ref <18)

## 2022-08-19 LAB — SARS CORONAVIRUS 2 BY RT PCR: SARS Coronavirus 2 by RT PCR: NEGATIVE

## 2022-08-19 MED ORDER — LACTATED RINGERS IV BOLUS
500.0000 mL | Freq: Once | INTRAVENOUS | Status: AC
  Administered 2022-08-19: 500 mL via INTRAVENOUS

## 2022-08-19 NOTE — Discharge Instructions (Addendum)
There is no clear cause for your weakness.  Make sure you take your medicines as prescribed and follow-up with your primary care physician.  Otherwise, if you develop new or worsening weakness, dizziness or lightheadedness, passing out, chest pain, shortness of breath, or any other new/concerning symptoms then return to the ER or call 911.

## 2022-08-19 NOTE — ED Triage Notes (Signed)
BIB EMS, [facility]. General weakness for a couple of days, went to clinic, Covid neg, pt states she just feels weak in legs, facility concerned bp 170/90. 152/70-80-100RA- CBG 123 #20 L AC.

## 2022-08-19 NOTE — ED Provider Notes (Signed)
[facility] AT [facility] Provider Note   CSN: [id number] Arrival date & time: 08/19/22  1624     History  Chief Complaint  Patient presents with   Weakness   Hypertension    ELLYANNA Sloan is a 89 y.o. female.  HPI 89 year old female with a history of hypertension, hyperlipidemia, hypothyroidism, prior stroke, presents with generalized weakness and lightheadedness.  She thinks this has been ongoing for couple days.  She went to the clinic at [place] facility [place]), and they sent her here.  They did a point-of-care COVID which was negative.  Patient states she has some chronic dyspnea on exertion but it does not really limit her activities.  Has been ongoing for months.  She denies fever but feels like both legs are weak since yesterday or the day before.  No new unilateral weakness.  She feels more lightheaded than typical over the past couple days when she stands.  Denies headache, chest pain, melena or hematochezia, abdominal pain, or vomiting.  Home Medications Prior to Admission medications   Medication Sig Start Date End Date Taking? Authorizing Provider  albuterol (VENTOLIN HFA) 108 (90 Base) MCG/ACT inhaler Inhale 1 puff into the lungs as needed for wheezing or shortness of breath.    [provider]  amLODipine (NORVASC) 10 MG tablet Take 0.5 tablets (5 mg total) by mouth daily. 04/30/20   Mahlon Gammon, MD  atorvastatin (LIPITOR) 10 MG tablet Take 2 tablets (20 mg total) by mouth daily. 04/09/20   Marvel Plan, MD  cetirizine (ZYRTEC) 10 MG tablet Take 10 mg by mouth as needed.    [provider]  cholecalciferol (VITAMIN D3) 25 MCG (1000 UNIT) tablet Take 1,000 Units by mouth daily.    [provider]  clopidogrel (PLAVIX) 75 MG tablet Take 1 tablet (75 mg total) by mouth daily. 04/09/20   Marvel Plan, MD  hydrocortisone 1 % ointment Apply 1 application topically as needed.    [provider]   levothyroxine (SYNTHROID) 50 MCG tablet Take 50 mcg by mouth daily. 08/01/19   [provider]  meclizine (ANTIVERT) 12.5 MG tablet Take 1 tablet (12.5 mg total) by mouth 3 (three) times daily as needed for dizziness. 10/10/19   Lorre Nick, MD  zinc oxide 20 % ointment Apply 1 application topically as needed for irritation.    [provider]      Allergies    Sulfa antibiotics    Review of Systems   Review of Systems  Constitutional:  Negative for fever.  HENT:  Negative for congestion.   Respiratory:  Positive for shortness of breath (on exertion). Negative for cough.   Cardiovascular:  Negative for chest pain.  Gastrointestinal:  Negative for abdominal pain, blood in stool, diarrhea and vomiting.  Genitourinary:  Negative for dysuria.  Musculoskeletal:  Negative for back pain.  Neurological:  Positive for weakness and light-headedness. Negative for dizziness, syncope, numbness and headaches.    Physical Exam Updated Vital Signs BP (!) 177/71   Pulse 72   Temp 97.6 F (36.4 C) (Oral)   Resp 19   Ht 5\' 3"  (1.6 m)   Wt 56.7 kg   SpO2 100%   BMI 22.14 kg/m  Physical Exam Vitals and nursing note reviewed.  Constitutional:      General: She is not in acute distress.    Appearance: She is well-developed. She is not ill-appearing or diaphoretic.  HENT:  Head: Normocephalic and atraumatic.  Eyes:     Extraocular Movements: Extraocular movements intact.     Pupils: Pupils are equal, round, and reactive to light.  Cardiovascular:     Rate and Rhythm: Normal rate and regular rhythm.     Heart sounds: Normal heart sounds.  Pulmonary:     Effort: Pulmonary effort is normal.     Breath sounds: Normal breath sounds.  Abdominal:     Palpations: Abdomen is soft.     Tenderness: There is no abdominal tenderness.  Musculoskeletal:     Right lower leg: No edema.     Left lower leg: No edema.  Skin:    General: Skin is warm and dry.  Neurological:      Mental Status: She is alert.     Comments: CN 3-12 grossly intact. 5/5 strength in all 4 extremities. Maybe slightly worse in RLE compared to left, but patient feels this is chronic/baseline for her. Grossly normal sensation. Normal finger to nose.      ED Results / Procedures / Treatments   Labs (all labs ordered are listed, but only abnormal results are displayed) Labs Reviewed  COMPREHENSIVE METABOLIC PANEL - Abnormal; Notable for the following components:      Result Value   Glucose, Bld 106 (*)    BUN 24 (*)    All other components within normal limits  URINALYSIS, W/ REFLEX TO CULTURE (INFECTION SUSPECTED) - Abnormal; Notable for the following components:   Bacteria, UA RARE (*)    All other components within normal limits  I-STAT CHEM 8, ED - Abnormal; Notable for the following components:   BUN 24 (*)    Glucose, Bld 109 (*)    All other components within normal limits  SARS CORONAVIRUS 2 BY RT PCR  BRAIN NATRIURETIC PEPTIDE  CBC WITH DIFFERENTIAL/PLATELET  TROPONIN I (HIGH SENSITIVITY)  TROPONIN I (HIGH SENSITIVITY)    EKG EKG Interpretation Date/Time:  Friday August 19 2022 18:24:16 EDT Ventricular Rate:  67 PR Interval:  184 QRS Duration:  91 QT Interval:  420 QTC Calculation: 444 R Axis:   4  Text Interpretation: Sinus rhythm Low voltage, precordial leads Consider anterior infarct no acute ST/T changes Confirmed by Pricilla Loveless [id number]) on 08/19/2022 6:35:14 PM  Radiology DG Chest 2 View  Result Date: 08/19/2022 CLINICAL DATA:  Dyspnea EXAM: CHEST - 2 VIEW COMPARISON:  11/24/2020 FINDINGS: The heart size and mediastinal contours are within normal limits. No pneumothorax, effusion or edema. Subtle opacity left lung base. The visualized skeletal structures are unremarkable. Degenerative changes of the spine. IMPRESSION: Subtle left lung base opacity. Atelectasis versus infiltrate. Recommend follow-up. Electronically Signed   By: Karen Kays M.D.   On: 08/19/2022  17:25    Procedures Procedures    Medications Ordered in ED Medications  lactated ringers bolus 500 mL (0 mLs Intravenous Stopped 08/19/22 1921)    ED Course/ Medical Decision Making/ A&P                                 Medical Decision Making Amount and/or Complexity of Data Reviewed Labs: ordered.    Details: Mild BUN elevation but other labs are unremarkable. Radiology: ordered and independent interpretation performed.    Details: No lobar pneumonia ECG/medicine tests: ordered and independent interpretation performed.    Details: No ischemia   Patient presents with generalized weakness and some lightheadedness.  Later was talking to the  son at the bedside and he indicates this has been an on-and-off problem here and there for a while.  However currently the patient is ambulating back and forth to the bathroom with her walker with no assistance required.  She feels well.  She would like to be discharged and at this point there is no emergent condition found.  Highly doubt stroke given no unilateral symptoms.  There was a questionable left lung opacity but she has no cough, fever, etc. and so I think pneumonia is pretty unlikely.  Otherwise, doubt ACS, PE, or neuro emergency.  She was given a small bolus of fluids.  She is developing some hypertension but son states this time for her to take her evening blood pressure medicine and they would like to take it when she gets home.  Will discharge home with return precautions.        Final Clinical Impression(s) / ED Diagnoses Final diagnoses:  Generalized weakness    Rx / DC Orders ED Discharge Orders     None         Pricilla Loveless, MD 08/19/22 2006

## 2022-08-25 ENCOUNTER — Telehealth: Payer: Self-pay | Admitting: *Deleted

## 2022-08-25 NOTE — Telephone Encounter (Signed)
Transition Care Management Unsuccessful Follow-up Telephone Call  Date of discharge and from where:  [facility]  08/19/2022  Attempts:  1st Attempt  Reason for unsuccessful TCM follow-up call:  No answer/busy

## 2023-08-07 ENCOUNTER — Ambulatory Visit (INDEPENDENT_AMBULATORY_CARE_PROVIDER_SITE_OTHER): Admitting: Orthopedic Surgery

## 2023-08-07 ENCOUNTER — Encounter: Payer: Self-pay | Admitting: Orthopedic Surgery

## 2023-08-07 DIAGNOSIS — I87331 Chronic venous hypertension (idiopathic) with ulcer and inflammation of right lower extremity: Secondary | ICD-10-CM

## 2023-08-07 NOTE — Progress Notes (Signed)
 Office Visit Note   Patient: Shelley  ROZALYNN Sloan           Date of Birth: August 25, 1923           MRN: [id number] Visit Date: 08/07/2023              Requested by: Valentin Skates, DO [address] [place],  [place] [address] PCP: Valentin Skates, DO  Chief Complaint  Patient presents with   Right Leg - Wound Check      HPI: Patient is a 89 year old woman who is seen for initial evaluation for venous insufficiency ulceration right lower extremity.  Patient states the ulceration started in June.  She has been in an [employer] broadcast [employer].  Patient states with elevation her leg does swell and without elevation she has weeping edema.  Patient has been in an Radio broadcast assistant 3 times.  Assessment & Plan: Visit Diagnoses:  1. Chronic venous hypertension (idiopathic) with ulcer and inflammation of right lower extremity (HCC)     Plan: Will reapply in a compression wrap.  Anticipate this should resolve with serial compression.  Follow-Up Instructions: No follow-ups on file.   Ortho Exam  Patient is alert, oriented, no adenopathy, well-dressed, normal affect, normal respiratory effort. Examination patient has dry flaky right lower extremity this was removed there is superficial epithelization involving almost the entire right calf.    Imaging: No results found.    Labs: Lab Results  Component Value Date   HGBA1C 5.8 (H) 04/07/2020     Lab Results  Component Value Date   ALBUMIN 4.1 08/19/2022   ALBUMIN 3.5 04/16/2020   ALBUMIN 3.5 04/06/2020    No results found for: MG No results found for: VD25OH  No results found for: PREALBUMIN    Latest Ref Rng & Units 08/19/2022    6:46 PM 08/19/2022    6:04 PM 04/16/2020    1:34 PM  CBC EXTENDED  WBC 4.0 - 10.5 K/uL  7.2  5.5      RBC 3.87 - 5.11 MIL/uL  4.37  3.96      Hemoglobin 12.0 - 15.0 g/dL 87.0  86.9  88.3      HCT 36.0 - 46.0 % 38.0  41.4  36      Platelets 150 - 400 K/uL  235  297      NEUT# 1.7 - 7.7 K/uL  4.8  3,053.00      Lymph#  0.7 - 4.0 K/uL  1.6       This result is from an external source.     There is no height or weight on file to calculate BMI.  Orders:  No orders of the defined types were placed in this encounter.  No orders of the defined types were placed in this encounter.    Procedures: No procedures performed  Clinical Data: No additional findings.  ROS:  All other systems negative, except as noted in the HPI. Review of Systems  Objective: Vital Signs: There were no vitals taken for this visit.  Specialty Comments:  No specialty comments available.  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 eye with active choroidal neovascularization (HCC) 02/17/2021   Posterior vitreous detachment of both eyes 06/29/2020   Right epiretinal membrane 06/29/2020   Intermediate stage nonexudative age-related macular degeneration of both eyes 06/29/2020   Prediabetes 04/28/2020   Hyperlipidemia 04/28/2020   Dizziness 04/28/2020   Hypothyroidism 04/28/2020   GERD (  gastroesophageal reflux disease) 04/28/2020   Frequent urinary incontinence 04/28/2020   Acute ischemic stroke (HCC) 04/06/2020   Seasonal allergic rhinitis 06/06/2019   Abnormal gait 01/10/2019   Essential hypertension 01/10/2019   Hearing loss 01/10/2019   Past Medical History:  Diagnosis Date   Arthritis    Hypertension     History reviewed. No pertinent family history.  Past Surgical History:  Procedure Laterality Date   ABDOMINAL HYSTERECTOMY     JOINT REPLACEMENT Right 1995   JOINT REPLACEMENT Left 1997   Social History   Occupational History   Not on file  Tobacco Use   Smoking status: Former    Current packs/day: 0.00    Average packs/day: 0.5 packs/day for 18.0 years (9.0 ttl pk-yrs)    Types: Cigarettes    Start date: 6    Quit date: 79    Years since quitting: 50.6   Smokeless tobacco: Never  Vaping Use   Vaping status:  Never Used  Substance and Sexual Activity   Alcohol use: Yes   Drug use: Never   Sexual activity: Not on file

## 2023-08-14 ENCOUNTER — Encounter: Payer: Self-pay | Admitting: Orthopedic Surgery

## 2023-08-14 ENCOUNTER — Ambulatory Visit (INDEPENDENT_AMBULATORY_CARE_PROVIDER_SITE_OTHER): Admitting: Orthopedic Surgery

## 2023-08-14 DIAGNOSIS — I87331 Chronic venous hypertension (idiopathic) with ulcer and inflammation of right lower extremity: Secondary | ICD-10-CM

## 2023-08-14 NOTE — Progress Notes (Signed)
 Office Visit Note   Patient: Shelley Sloan  AHLAM PISCITELLI           Date of Birth: 01/19/1923           MRN: [id number] Visit Date: 08/14/2023              Requested by: Valentin Skates, DO [address] [place],  [place] [address] PCP: Valentin Skates, DO  Chief Complaint  Patient presents with   Right Leg - Wound Check      HPI: Patient is a 89 year old woman who presents in follow-up for venous insufficiency ulceration right lower extremity.  Assessment & Plan: Visit Diagnoses:  1. Chronic venous hypertension (idiopathic) with ulcer and inflammation of right lower extremity (HCC)     Plan: She will advance to her knee-high compression socks.  She was placed in an Ace wrap.  Follow-Up Instructions: Return if symptoms worsen or fail to improve.   Ortho Exam  Patient is alert, oriented, no adenopathy, well-dressed, normal affect, normal respiratory effort. Examination the venous ulceration and swelling in the right lower extremity has resolved.  Her skin is intact there is no cellulitis no drainage.  She has a strong dorsalis pedis pulse.  No arterial insufficiency.    Imaging: No results found.    Labs: Lab Results  Component Value Date   HGBA1C 5.8 (H) 04/07/2020     Lab Results  Component Value Date   ALBUMIN 4.1 08/19/2022   ALBUMIN 3.5 04/16/2020   ALBUMIN 3.5 04/06/2020    No results found for: MG No results found for: VD25OH  No results found for: PREALBUMIN    Latest Ref Rng & Units 08/19/2022    6:46 PM 08/19/2022    6:04 PM 04/16/2020    1:34 PM  CBC EXTENDED  WBC 4.0 - 10.5 K/uL  7.2  5.5      RBC 3.87 - 5.11 MIL/uL  4.37  3.96      Hemoglobin 12.0 - 15.0 g/dL 87.0  86.9  88.3      HCT 36.0 - 46.0 % 38.0  41.4  36      Platelets 150 - 400 K/uL  235  297      NEUT# 1.7 - 7.7 K/uL  4.8  3,053.00      Lymph# 0.7 - 4.0 K/uL  1.6       This result is from an external source.     There is no height or weight on file to calculate BMI.  Orders:   No orders of the defined types were placed in this encounter.  No orders of the defined types were placed in this encounter.    Procedures: No procedures performed  Clinical Data: No additional findings.  ROS:  All other systems negative, except as noted in the HPI. Review of Systems  Objective: Vital Signs: There were no vitals taken for this visit.  Specialty Comments:  No specialty comments available.  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 eye with active choroidal neovascularization (HCC) 02/17/2021   Posterior vitreous detachment of both eyes 06/29/2020   Right epiretinal membrane 06/29/2020   Intermediate stage nonexudative age-related macular degeneration of both eyes 06/29/2020   Prediabetes 04/28/2020   Hyperlipidemia 04/28/2020   Dizziness 04/28/2020   Hypothyroidism 04/28/2020   GERD (gastroesophageal reflux disease) 04/28/2020   Frequent urinary incontinence 04/28/2020   Acute ischemic stroke (HCC) 04/06/2020   Seasonal allergic rhinitis 06/06/2019  Abnormal gait 01/10/2019   Essential hypertension 01/10/2019   Hearing loss 01/10/2019   Past Medical History:  Diagnosis Date   Arthritis    Hypertension     History reviewed. No pertinent family history.  Past Surgical History:  Procedure Laterality Date   ABDOMINAL HYSTERECTOMY     JOINT REPLACEMENT Right 1995   JOINT REPLACEMENT Left 1997   Social History   Occupational History   Not on file  Tobacco Use   Smoking status: Former    Current packs/day: 0.00    Average packs/day: 0.5 packs/day for 18.0 years (9.0 ttl pk-yrs)    Types: Cigarettes    Start date: 21    Quit date: 62    Years since quitting: 50.6   Smokeless tobacco: Never  Vaping Use   Vaping status: Never Used  Substance and Sexual Activity   Alcohol use: Yes   Drug use: Never   Sexual activity: Not on file

## 2023-08-31 ENCOUNTER — Ambulatory Visit: Admitting: Orthopedic Surgery

## 2023-08-31 DIAGNOSIS — I87331 Chronic venous hypertension (idiopathic) with ulcer and inflammation of right lower extremity: Secondary | ICD-10-CM

## 2023-09-01 ENCOUNTER — Encounter: Payer: Self-pay | Admitting: Orthopedic Surgery

## 2023-09-01 NOTE — Progress Notes (Signed)
 Office Visit Note   Patient: Shelley Sloan  ASTRYD PEARCY           Date of Birth: [birth year]           MRN: [id number] Visit Date: 08/31/2023              Requested by: Valentin Skates, DO [address] [place],  [place] [address] PCP: Valentin Skates, DO  Chief Complaint  Patient presents with   Left Leg - Follow-up      HPI: Discussed the use of AI scribe software for clinical note transcription with the patient, who gave verbal consent to proceed.  History of Present Illness Rosealee  JULIANNA Fifield is a 89 year old female with venous stasis ulcers who presents with leg swelling and ulcer management. She is accompanied by her family member.  She has multiple venous stasis ulcers on her leg, accompanied by swelling. The ulcers have been wrapped a couple of times by her caretaker, but the consistency and material of the wrapping are unclear.  There is no mention of prior diagnostic studies or treatments.  She acknowledges difficulty in maintaining adequate fluid intake, which is a common issue as she ages.     Assessment & Plan: Visit Diagnoses:  1. Chronic venous hypertension (idiopathic) with ulcer and inflammation of right lower extremity (HCC)     Plan: Assessment and Plan Assessment & Plan Venous stasis ulcers of lower extremity with associated dermatitis and localized foot edema Multiple venous stasis ulcers with fibrinosexidative tissue, dermatitis, and localized foot edema due to improper leg wrapping. No cellulitis or drainage. Current management inadequate, requiring new treatment plan. - Instructed to wash affected area with soap and water daily, using Dove or preferred soap. - Dry area thoroughly after washing. - Apply Vash to dampened 4x4 regular non-sterile gauze and place over wounds. - Wrap leg with Ace Wrap from ball of foot to knee to prevent swelling and promote fluid drainage. - Elevate foot above heart at all times except when walking to reduce swelling. - Encouraged  walking to help pump fluid up the leg. - Recommended protein supplements, such as Ensure or Boost, twice daily to maintain fluid in blood vessels. - Advised against using bourbon due to diuretic effect and potential fluid loss. - Schedule follow-up appointment in two weeks to monitor healing progress and adherence to treatment plan.      Follow-Up Instructions: Return in about 2 weeks (around 09/14/2023).   Ortho Exam  Patient is alert, oriented, no adenopathy, well-dressed, normal affect, normal respiratory effort. Physical Exam EXTREMITIES: Swelling in the foot due to wrapping from ankle proximally. SKIN: Multiple venous stasis ulcers, 0.5 cm diameter, with fibrinosexidative tissue. Dermatitis present. No cellulitis, no apparent drainage.      Imaging: No results found. No images are attached to the encounter.  Labs: Lab Results  Component Value Date   HGBA1C 5.8 (H) 04/07/2020     Lab Results  Component Value Date   ALBUMIN 4.1 08/19/2022   ALBUMIN 3.5 04/16/2020   ALBUMIN 3.5 04/06/2020    No results found for: MG No results found for: VD25OH  No results found for: PREALBUMIN    Latest Ref Rng & Units 08/19/2022    6:46 PM 08/19/2022    6:04 PM 04/16/2020    1:34 PM  CBC EXTENDED  WBC 4.0 - 10.5 K/uL  7.2  5.5      RBC 3.87 - 5.11 MIL/uL  4.37  3.96      Hemoglobin 12.0 -  15.0 g/dL 87.0  86.9  88.3      HCT 36.0 - 46.0 % 38.0  41.4  36      Platelets 150 - 400 K/uL  235  297      NEUT# 1.7 - 7.7 K/uL  4.8  3,053.00      Lymph# 0.7 - 4.0 K/uL  1.6       This result is from an external source.     There is no height or weight on file to calculate BMI.  Orders:  No orders of the defined types were placed in this encounter.  No orders of the defined types were placed in this encounter.    Procedures: No procedures performed  Clinical Data: No additional findings.  ROS:  All other systems negative, except as noted in the HPI. Review of  Systems  Objective: Vital Signs: There were no vitals taken for this visit.  Specialty Comments:  No specialty comments available.  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 eye with active choroidal neovascularization (HCC) 02/17/2021   Posterior vitreous detachment of both eyes 06/29/2020   Right epiretinal membrane 06/29/2020   Intermediate stage nonexudative age-related macular degeneration of both eyes 06/29/2020   Prediabetes 04/28/2020   Hyperlipidemia 04/28/2020   Dizziness 04/28/2020   Hypothyroidism 04/28/2020   GERD (gastroesophageal reflux disease) 04/28/2020   Frequent urinary incontinence 04/28/2020   Acute ischemic stroke (HCC) 04/06/2020   Seasonal allergic rhinitis 06/06/2019   Abnormal gait 01/10/2019   Essential hypertension 01/10/2019   Hearing loss 01/10/2019   Past Medical History:  Diagnosis Date   Arthritis    Hypertension     History reviewed. No pertinent family history.  Past Surgical History:  Procedure Laterality Date   ABDOMINAL HYSTERECTOMY     JOINT REPLACEMENT Right 1995   JOINT REPLACEMENT Left 1997   Social History   Occupational History   Not on file  Tobacco Use   Smoking status: Former    Current packs/day: 0.00    Average packs/day: 0.5 packs/day for 18.0 years (9.0 ttl pk-yrs)    Types: Cigarettes    Start date: 48    Quit date: 33    Years since quitting: 50.6   Smokeless tobacco: Never  Vaping Use   Vaping status: Never Used  Substance and Sexual Activity   Alcohol use: Yes   Drug use: Never   Sexual activity: Not on file

## 2023-09-14 ENCOUNTER — Ambulatory Visit (INDEPENDENT_AMBULATORY_CARE_PROVIDER_SITE_OTHER): Admitting: Orthopedic Surgery

## 2023-09-14 DIAGNOSIS — I87331 Chronic venous hypertension (idiopathic) with ulcer and inflammation of right lower extremity: Secondary | ICD-10-CM

## 2023-09-17 ENCOUNTER — Encounter: Payer: Self-pay | Admitting: Orthopedic Surgery

## 2023-09-17 NOTE — Progress Notes (Signed)
 Office Visit Note   Patient: Shelley  SHAYLAN Sloan           Date of Birth: 10-05-1923           MRN: [id number] Visit Date: 09/14/2023              Requested by: Valentin Skates, DO [address] [place],  [place] [address] PCP: Valentin Skates, DO  Chief Complaint  Patient presents with   Left Leg - Follow-up      HPI: Discussed the use of AI scribe software for clinical note transcription with the patient, who gave verbal consent to proceed.  History of Present Illness Shelley Sloan  Shelley Sloan is a 89 year old female with venous insufficiency who presents for follow-up of bilateral lower extremity ulcerations.  The duration of these symptoms is unspecified.  She has thin superficial venous ulcers on both legs that are weeping clear edema. No signs of cellulitis or infection are present.     Assessment & Plan: Visit Diagnoses: No diagnosis found.  Plan: Assessment and Plan Assessment & Plan Chronic venous insufficiency with bilateral lower extremity venous ulcers and edema Chronic venous insufficiency with bilateral venous ulcers and edema. Ulcers are thin, superficial, and weeping. No infection present. Pitting edema and palpable pulses bilaterally. - Continue VAS dressing changes. - Apply ACE compression wrap to tibial tubercle. - Recommend foot elevation to heart level. - Re-evaluate in four weeks.      Follow-Up Instructions: No follow-ups on file.   Ortho Exam  Patient is alert, oriented, no adenopathy, well-dressed, normal affect, normal respiratory effort. Physical Exam EXTREMITIES: Pitting edema with thin shiny skin on both legs. Palpable pulses bilaterally. Thin superficial venous ulcers with weeping clear edema on both legs. No cellulitis or signs of infection on both legs.      Imaging: No results found. No images are attached to the encounter.  Labs: Lab Results  Component Value Date   HGBA1C 5.8 (H) 04/07/2020     Lab Results  Component Value Date    ALBUMIN 4.1 08/19/2022   ALBUMIN 3.5 04/16/2020   ALBUMIN 3.5 04/06/2020    No results found for: MG No results found for: VD25OH  No results found for: PREALBUMIN    Latest Ref Rng & Units 08/19/2022    6:46 PM 08/19/2022    6:04 PM 04/16/2020    1:34 PM  CBC EXTENDED  WBC 4.0 - 10.5 K/uL  7.2  5.5      RBC 3.87 - 5.11 MIL/uL  4.37  3.96      Hemoglobin 12.0 - 15.0 g/dL 87.0  86.9  88.3      HCT 36.0 - 46.0 % 38.0  41.4  36      Platelets 150 - 400 K/uL  235  297      NEUT# 1.7 - 7.7 K/uL  4.8  3,053.00      Lymph# 0.7 - 4.0 K/uL  1.6       This result is from an external source.     There is no height or weight on file to calculate BMI.  Orders:  No orders of the defined types were placed in this encounter.  No orders of the defined types were placed in this encounter.    Procedures: No procedures performed  Clinical Data: No additional findings.  ROS:  All other systems negative, except as noted in the HPI. Review of Systems  Objective: Vital Signs: There were no vitals taken for this visit.  Specialty Comments:  No specialty comments available.  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 eye with active choroidal neovascularization (HCC) 02/17/2021   Posterior vitreous detachment of both eyes 06/29/2020   Right epiretinal membrane 06/29/2020   Intermediate stage nonexudative age-related macular degeneration of both eyes 06/29/2020   Prediabetes 04/28/2020   Hyperlipidemia 04/28/2020   Dizziness 04/28/2020   Hypothyroidism 04/28/2020   GERD (gastroesophageal reflux disease) 04/28/2020   Frequent urinary incontinence 04/28/2020   Acute ischemic stroke (HCC) 04/06/2020   Seasonal allergic rhinitis 06/06/2019   Abnormal gait 01/10/2019   Essential hypertension 01/10/2019   Hearing loss 01/10/2019   Past Medical History:  Diagnosis Date   Arthritis    Hypertension      No family history on file.  Past Surgical History:  Procedure Laterality Date   ABDOMINAL HYSTERECTOMY     JOINT REPLACEMENT Right 1995   JOINT REPLACEMENT Left 1997   Social History   Occupational History   Not on file  Tobacco Use   Smoking status: Former    Current packs/day: 0.00    Average packs/day: 0.5 packs/day for 18.0 years (9.0 ttl pk-yrs)    Types: Cigarettes    Start date: 97    Quit date: 80    Years since quitting: 50.7   Smokeless tobacco: Never  Vaping Use   Vaping status: Never Used  Substance and Sexual Activity   Alcohol use: Yes   Drug use: Never   Sexual activity: Not on file

## 2023-10-17 ENCOUNTER — Ambulatory Visit (INDEPENDENT_AMBULATORY_CARE_PROVIDER_SITE_OTHER): Admitting: Orthopedic Surgery

## 2023-10-17 DIAGNOSIS — I87331 Chronic venous hypertension (idiopathic) with ulcer and inflammation of right lower extremity: Secondary | ICD-10-CM

## 2023-10-18 ENCOUNTER — Encounter: Payer: Self-pay | Admitting: Orthopedic Surgery

## 2023-10-18 NOTE — Progress Notes (Signed)
 Office Visit Note   Patient: Shelley  PRESSLEY Sloan           Date of Birth: 11-30-1923           MRN: [id number] Visit Date: 10/17/2023              Requested by: Valentin Skates, DO [address] [place],  [place] [address] PCP: Valentin Skates, DO  Chief Complaint  Patient presents with   Left Leg - Wound Check      HPI: Discussed the use of AI scribe software for clinical note transcription with the patient, who gave verbal consent to proceed.  History of Present Illness Shelley  F Sloan is a 89 year old female who presents for management of venous ulcers on her legs.  She has multiple small venous ulcers on both legs. She has been using bandages and medicated socks designed for open wounds to aid in healing.  The ulcers are located on both the left and right lower extremities, with some areas of skin having come off. She is concerned about the 'raw places' on her legs and the need to cover them properly. She has not been applying any additional treatments herself but has had assistance with washing and applying necessary dressings.  She is considering using gauze and ace wraps until she can obtain compression stockings. No cellulitis, drainage, odor, or signs of infection are reported.     Assessment & Plan: Visit Diagnoses: No diagnosis found.  Plan: Assessment and Plan Assessment & Plan Bilateral lower extremity venous ulcers Chronic venous ulcers with superficial epithelialization and interval healing. No signs of cellulitis, drainage, odor, or infection. - Recommend wearing Vive compression socks continuously for graduated compression. - Change compression socks every two to three days. - Use four by four gauze and ace wrap until compression socks are obtained. - Reevaluate in four weeks.      Follow-Up Instructions: No follow-ups on file.   Ortho Exam  Patient is alert, oriented, no adenopathy, well-dressed, normal affect, normal respiratory effort. Physical  Exam EXTREMITIES: Dry healing skin on both legs. Left calf circumference 29 cm. Right calf circumference 31 cm. Multiple small venous ulcers on both legs. No cellulitis, drainage, odor, or signs of infection.      Imaging: No results found. No images are attached to the encounter.  Labs: Lab Results  Component Value Date   HGBA1C 5.8 (H) 04/07/2020     Lab Results  Component Value Date   ALBUMIN 4.1 08/19/2022   ALBUMIN 3.5 04/16/2020   ALBUMIN 3.5 04/06/2020    No results found for: MG No results found for: VD25OH  No results found for: PREALBUMIN    Latest Ref Rng & Units 08/19/2022    6:46 PM 08/19/2022    6:04 PM 04/16/2020    1:34 PM  CBC EXTENDED  WBC 4.0 - 10.5 K/uL  7.2  5.5      RBC 3.87 - 5.11 MIL/uL  4.37  3.96      Hemoglobin 12.0 - 15.0 g/dL 87.0  86.9  88.3      HCT 36.0 - 46.0 % 38.0  41.4  36      Platelets 150 - 400 K/uL  235  297      NEUT# 1.7 - 7.7 K/uL  4.8  3,053.00      Lymph# 0.7 - 4.0 K/uL  1.6       This result is from an external source.     There is no  height or weight on file to calculate BMI.  Orders:  No orders of the defined types were placed in this encounter.  No orders of the defined types were placed in this encounter.    Procedures: No procedures performed  Clinical Data: No additional findings.  ROS:  All other systems negative, except as noted in the HPI. Review of Systems  Objective: Vital Signs: There were no vitals taken for this visit.  Specialty Comments:  No specialty comments available.  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 eye with active choroidal neovascularization (HCC) 02/17/2021   Posterior vitreous detachment of both eyes 06/29/2020   Right epiretinal membrane 06/29/2020   Intermediate stage nonexudative age-related macular degeneration of both eyes 06/29/2020   Prediabetes 04/28/2020   Hyperlipidemia  04/28/2020   Dizziness 04/28/2020   Hypothyroidism 04/28/2020   GERD (gastroesophageal reflux disease) 04/28/2020   Frequent urinary incontinence 04/28/2020   Acute ischemic stroke (HCC) 04/06/2020   Seasonal allergic rhinitis 06/06/2019   Abnormal gait 01/10/2019   Essential hypertension 01/10/2019   Hearing loss 01/10/2019   Past Medical History:  Diagnosis Date   Arthritis    Hypertension     History reviewed. No pertinent family history.  Past Surgical History:  Procedure Laterality Date   ABDOMINAL HYSTERECTOMY     JOINT REPLACEMENT Right 1995   JOINT REPLACEMENT Left 1997   Social History   Occupational History   Not on file  Tobacco Use   Smoking status: Former    Current packs/day: 0.00    Average packs/day: 0.5 packs/day for 18.0 years (9.0 ttl pk-yrs)    Types: Cigarettes    Start date: 74    Quit date: 19    Years since quitting: 50.8   Smokeless tobacco: Never  Vaping Use   Vaping status: Never Used  Substance and Sexual Activity   Alcohol use: Yes   Drug use: Never   Sexual activity: Not on file

## 2023-11-14 ENCOUNTER — Ambulatory Visit (INDEPENDENT_AMBULATORY_CARE_PROVIDER_SITE_OTHER): Admitting: Physician Assistant

## 2023-11-14 DIAGNOSIS — L97919 Non-pressure chronic ulcer of unspecified part of right lower leg with unspecified severity: Secondary | ICD-10-CM | POA: Diagnosis not present

## 2023-11-14 DIAGNOSIS — I872 Venous insufficiency (chronic) (peripheral): Secondary | ICD-10-CM

## 2023-11-14 DIAGNOSIS — I83019 Varicose veins of right lower extremity with ulcer of unspecified site: Secondary | ICD-10-CM | POA: Diagnosis not present

## 2023-11-14 DIAGNOSIS — I83029 Varicose veins of left lower extremity with ulcer of unspecified site: Secondary | ICD-10-CM | POA: Diagnosis not present

## 2023-11-14 DIAGNOSIS — L97929 Non-pressure chronic ulcer of unspecified part of left lower leg with unspecified severity: Secondary | ICD-10-CM

## 2023-11-15 ENCOUNTER — Encounter: Payer: Self-pay | Admitting: Physician Assistant

## 2023-11-15 NOTE — Progress Notes (Signed)
 Office Visit Note   Patient: Shelley  MERITA Sloan           Date of Birth: [birth year]           MRN: [id number] Visit Date: 11/14/2023              Requested by: Valentin Skates, DO [address] [place],  [place] [address] PCP: Valentin Skates, DO  Chief Complaint  Patient presents with   Right Leg - Wound Check   Left Leg - Wound Check      HPI: 89 y/o female with history of venous ulcers with chronic edema.  She has had multiple small venous ulcers on both legs. She has been using bandages and medicated socks designed for open wounds to aid in healing.   She has been wearing the Vive compression socks.  She has healed most of the small ulcer areas.  She has been using Vashe with guaze over the ulcer on the left LE then the sock on top.    Assessment & Plan: Visit Diagnoses:  1. Venous insufficiency (chronic) (peripheral)   2. Venous ulcers of both lower extremities (HCC)     Plan: Continue Vashe guaze with Vive compression socks daily.  She is able to wear the socks for 2-3 days at a time and has help with donning and doffing.  Elevation multiple times a day above the level of the heart.  She was given a handout for proper elevation.    Follow-Up Instructions: Return in about 4 weeks (around 12/12/2023).   Ortho Exam  Patient is alert, oriented, no adenopathy, well-dressed, normal affect, normal respiratory effort. Good skin lines with minimal edema.  She has an ulcer on the left LE anterior shin that measures 1 cm x 0.5 cm.  No cellulitis or active drainage.  Her left calf circumference 29 cm. Right calf circumference 31 cm. These measurements have stayed the same since her last visit.  She has dry skin in B LE without weeping    Imaging: No results found. No images are attached to the encounter.  Labs: Lab Results  Component Value Date   HGBA1C 5.8 (H) 04/07/2020     Lab Results  Component Value Date   ALBUMIN 4.1 08/19/2022   ALBUMIN 3.5 04/16/2020   ALBUMIN 3.5  04/06/2020    No results found for: MG No results found for: VD25OH  No results found for: PREALBUMIN    Latest Ref Rng & Units 08/19/2022    6:46 PM 08/19/2022    6:04 PM 04/16/2020    1:34 PM  CBC EXTENDED  WBC 4.0 - 10.5 K/uL  7.2  5.5      RBC 3.87 - 5.11 MIL/uL  4.37  3.96      Hemoglobin 12.0 - 15.0 g/dL 87.0  86.9  88.3      HCT 36.0 - 46.0 % 38.0  41.4  36      Platelets 150 - 400 K/uL  235  297      NEUT# 1.7 - 7.7 K/uL  4.8  3,053.00      Lymph# 0.7 - 4.0 K/uL  1.6       This result is from an external source.     There is no height or weight on file to calculate BMI.  Orders:  No orders of the defined types were placed in this encounter.  No orders of the defined types were placed in this encounter.    Procedures: No procedures performed  Clinical Data: No additional findings.  ROS:  All other systems negative, except as noted in the HPI. Review of Systems  Objective: Vital Signs: There were no vitals taken for this visit.  Specialty Comments:  No specialty comments available.  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 eye with active choroidal neovascularization (HCC) 02/17/2021   Posterior vitreous detachment of both eyes 06/29/2020   Right epiretinal membrane 06/29/2020   Intermediate stage nonexudative age-related macular degeneration of both eyes 06/29/2020   Prediabetes 04/28/2020   Hyperlipidemia 04/28/2020   Dizziness 04/28/2020   Hypothyroidism 04/28/2020   GERD (gastroesophageal reflux disease) 04/28/2020   Frequent urinary incontinence 04/28/2020   Acute ischemic stroke (HCC) 04/06/2020   Seasonal allergic rhinitis 06/06/2019   Abnormal gait 01/10/2019   Essential hypertension 01/10/2019   Hearing loss 01/10/2019   Past Medical History:  Diagnosis Date   Arthritis    Hypertension     History reviewed. No pertinent family history.  Past  Surgical History:  Procedure Laterality Date   ABDOMINAL HYSTERECTOMY     JOINT REPLACEMENT Right 1995   JOINT REPLACEMENT Left 1997   Social History   Occupational History   Not on file  Tobacco Use   Smoking status: Former    Current packs/day: 0.00    Average packs/day: 0.5 packs/day for 18.0 years (9.0 ttl pk-yrs)    Types: Cigarettes    Start date: 36    Quit date: 20    Years since quitting: 50.8   Smokeless tobacco: Never  Vaping Use   Vaping status: Never Used  Substance and Sexual Activity   Alcohol use: Yes   Drug use: Never   Sexual activity: Not on file

## 2024-01-10 ENCOUNTER — Other Ambulatory Visit (HOSPITAL_COMMUNITY): Payer: Self-pay | Admitting: Internal Medicine

## 2024-01-10 DIAGNOSIS — R059 Cough, unspecified: Secondary | ICD-10-CM

## 2024-01-10 DIAGNOSIS — R1310 Dysphagia, unspecified: Secondary | ICD-10-CM

## 2024-02-06 ENCOUNTER — Encounter (HOSPITAL_COMMUNITY): Payer: Self-pay

## 2024-02-06 ENCOUNTER — Encounter (HOSPITAL_COMMUNITY)

## 2024-02-27 ENCOUNTER — Encounter (HOSPITAL_COMMUNITY)
